# Patient Record
Sex: Female | Born: 1960 | Race: White | Hispanic: No | Marital: Married | Smoking: Current every day smoker
Health system: Southern US, Community
[De-identification: ages and names within clinical notes are randomized; demographics above are authoritative.]

## PROBLEM LIST (undated history)

## (undated) DIAGNOSIS — M549 Dorsalgia, unspecified: Secondary | ICD-10-CM

## (undated) DIAGNOSIS — C641 Malignant neoplasm of right kidney, except renal pelvis: Principal | ICD-10-CM

## (undated) DIAGNOSIS — C801 Malignant (primary) neoplasm, unspecified: Secondary | ICD-10-CM

## (undated) HISTORY — PX: APPENDECTOMY: SHX54

## (undated) HISTORY — PX: OOPHORECTOMY: SHX86

## (undated) HISTORY — DX: Malignant neoplasm of right kidney, except renal pelvis: C64.1

## (undated) HISTORY — PX: INSERTION OF MESH: SHX5868

## (undated) HISTORY — PX: TONSILLECTOMY: SUR1361

## (undated) HISTORY — PX: ABDOMINAL HYSTERECTOMY: SHX81

---

## 1999-05-17 ENCOUNTER — Ambulatory Visit (HOSPITAL_BASED_OUTPATIENT_CLINIC_OR_DEPARTMENT_OTHER): Admission: RE | Admit: 1999-05-17 | Discharge: 1999-05-17 | Payer: Self-pay | Admitting: General Surgery

## 1999-12-02 ENCOUNTER — Encounter: Payer: Self-pay | Admitting: Family Medicine

## 1999-12-02 ENCOUNTER — Ambulatory Visit (HOSPITAL_COMMUNITY): Admission: RE | Admit: 1999-12-02 | Discharge: 1999-12-02 | Payer: Self-pay | Admitting: Family Medicine

## 2000-05-18 ENCOUNTER — Emergency Department (HOSPITAL_COMMUNITY): Admission: EM | Admit: 2000-05-18 | Discharge: 2000-05-19 | Payer: Self-pay | Admitting: Emergency Medicine

## 2000-05-19 ENCOUNTER — Encounter: Payer: Self-pay | Admitting: Emergency Medicine

## 2000-07-20 ENCOUNTER — Other Ambulatory Visit: Admission: RE | Admit: 2000-07-20 | Discharge: 2000-07-20 | Payer: Self-pay | Admitting: Family Medicine

## 2005-02-04 ENCOUNTER — Encounter: Admission: RE | Admit: 2005-02-04 | Discharge: 2005-02-04 | Payer: Self-pay | Admitting: Orthopaedic Surgery

## 2006-11-29 ENCOUNTER — Ambulatory Visit (HOSPITAL_BASED_OUTPATIENT_CLINIC_OR_DEPARTMENT_OTHER): Admission: RE | Admit: 2006-11-29 | Discharge: 2006-11-29 | Payer: Self-pay | Admitting: Orthopedic Surgery

## 2007-06-10 ENCOUNTER — Emergency Department (HOSPITAL_COMMUNITY): Admission: EM | Admit: 2007-06-10 | Discharge: 2007-06-10 | Payer: Self-pay | Admitting: Emergency Medicine

## 2010-08-17 ENCOUNTER — Ambulatory Visit (HOSPITAL_COMMUNITY)
Admission: AD | Admit: 2010-08-17 | Discharge: 2010-08-18 | Payer: Self-pay | Source: Home / Self Care | Attending: General Surgery | Admitting: General Surgery

## 2010-08-22 LAB — URINALYSIS, ROUTINE W REFLEX MICROSCOPIC
Bilirubin Urine: NEGATIVE
Ketones, ur: NEGATIVE mg/dL
Leukocytes, UA: NEGATIVE
Nitrite: NEGATIVE
Protein, ur: NEGATIVE mg/dL
Specific Gravity, Urine: 1.026 (ref 1.005–1.030)
Urine Glucose, Fasting: NEGATIVE mg/dL
Urobilinogen, UA: 0.2 mg/dL (ref 0.0–1.0)
pH: 6 (ref 5.0–8.0)

## 2010-08-22 LAB — SURGICAL PCR SCREEN
MRSA, PCR: NEGATIVE
Staphylococcus aureus: NEGATIVE

## 2010-08-22 LAB — BASIC METABOLIC PANEL
BUN: 11 mg/dL (ref 6–23)
CO2: 28 mEq/L (ref 19–32)
Calcium: 10.1 mg/dL (ref 8.4–10.5)
Chloride: 105 mEq/L (ref 96–112)
Creatinine, Ser: 0.86 mg/dL (ref 0.4–1.2)
GFR calc Af Amer: >60 mL/min (ref >60)
GFR calc non Af Amer: >60 mL/min (ref >60)
Glucose, Bld: 75 mg/dL (ref 70–99)
Potassium: 3.7 mEq/L (ref 3.5–5.1)
Sodium: 143 mEq/L (ref 135–145)

## 2010-08-22 LAB — CBC
HCT: 45.1 % (ref 36.0–46.0)
Hemoglobin: 15.1 g/dL — ABNORMAL HIGH (ref 12.0–15.0)
MCH: 30.4 pg (ref 26.0–34.0)
MCHC: 33.5 g/dL (ref 30.0–36.0)
MCV: 90.7 fL (ref 78.0–100.0)
Platelets: 322 10*3/uL (ref 150–400)
RBC: 4.97 MIL/uL (ref 3.87–5.11)
RDW: 12.7 % (ref 11.5–15.5)
WBC: 11.5 10*3/uL — ABNORMAL HIGH (ref 4.0–10.5)

## 2010-08-22 LAB — URINE CULTURE
Colony Count: 25000
Culture  Setup Time: 201201111119
Special Requests: POSITIVE

## 2010-08-22 LAB — URINE MICROSCOPIC-ADD ON

## 2010-12-23 NOTE — Op Note (Signed)
Maria Armstrong, Maria Armstrong                ACCOUNT NO.:  [id number]   MEDICAL RECORD NO.:  [id number]          PATIENT TYPE:  AMB   LOCATION:  DSC                          FACILITY:  [facility]   PHYSICIAN:  Cindee Salt, M.D.       DATE OF BIRTH:  08-15-1960   DATE OF PROCEDURE:  11/29/2006  DATE OF DISCHARGE:                               OPERATIVE REPORT   PREOPERATIVE DIAGNOSIS:  Carpal tunnel syndrome right hand.   POSTOPERATIVE DIAGNOSIS:  Carpal tunnel syndrome right hand.   OPERATION:  Decompression right median nerve.   SURGEON:  Cindee Salt, M.D.   ASSISTANT:  Carolyne Fiscal R.N.   ANESTHESIA:  Forearm based IV regional.   HISTORY:  The patient is a 50 year old female with a history of carpal  tunnel syndrome, EMG nerve conductions positive, which has not responded  to conservative treatment.  She is aware of risks and complications  including infection, recurrence, injury to arteries, nerves, tendons  incomplete relief of symptoms, dystrophy.  She has elected to proceed  with carpal tunnel release.  In the preoperative area questions were  encouraged and answered.  The extremity marked by both the patient and  surgeon.   PROCEDURE:  The patient was brought to the operating room where a  forearm based IV regional anesthetic was carried out without difficulty.  She was prepped using DuraPrep, supine position, right arm free.  After  3-minute dry time she was draped.  A longitudinal incision was made in  the palm, carried down through subcutaneous tissue.  Bleeders were  electrocauterized palmar fascia was split, superficial palmar arch  identified.  The flexor tendon to the ring and little finger identified  to the ulnar side of the median nerve.  Carpal retinaculum was incised  with sharp dissection.  A right-angle and Sewall retractor were placed  between skin and forearm fascia.  The fascia was released for  approximately a centimeter and a half proximal to the wrist crease under  direct  vision.  Area compression to the nerve was apparent.  No further  lesions were identified.  Tenosynovial tissue was moderately thickened.  The wound was irrigated.  The skin was closed with interrupted 4-0  Vicryl Rapide sutures.  A sterile compressive dressing and splint was  applied.  On deflation of the tourniquet all fingers immediately pinked.  She was taken to the recovery room for observation in satisfactory  condition.  She is discharged home to return to the [facility]  [place] in one week on [place].           ______________________________  Cindee Salt, M.D.    GK/MEDQ  D:  11/29/2006  T:  11/29/2006  Job:  [id number]   cc:   Melida Quitter, M.D.

## 2014-07-14 ENCOUNTER — Other Ambulatory Visit: Payer: Self-pay | Admitting: Family Medicine

## 2014-07-14 ENCOUNTER — Ambulatory Visit
Admission: RE | Admit: 2014-07-14 | Discharge: 2014-07-14 | Disposition: A | Discharge status: Home / Self Care | Payer: Commercial Managed Care - PPO | Source: Ambulatory Visit | Attending: Family Medicine | Admitting: Family Medicine

## 2014-07-14 DIAGNOSIS — J189 Pneumonia, unspecified organism: Secondary | ICD-10-CM

## 2015-01-07 ENCOUNTER — Other Ambulatory Visit: Payer: Self-pay | Admitting: Family Medicine

## 2015-01-07 ENCOUNTER — Ambulatory Visit
Admission: RE | Admit: 2015-01-07 | Discharge: 2015-01-07 | Disposition: A | Discharge status: Home / Self Care | Payer: PRIVATE HEALTH INSURANCE | Source: Ambulatory Visit | Attending: Family Medicine | Admitting: Family Medicine

## 2015-01-07 DIAGNOSIS — N2889 Other specified disorders of kidney and ureter: Secondary | ICD-10-CM

## 2015-01-07 DIAGNOSIS — K769 Liver disease, unspecified: Secondary | ICD-10-CM

## 2015-01-07 DIAGNOSIS — R1084 Generalized abdominal pain: Secondary | ICD-10-CM

## 2015-01-08 ENCOUNTER — Ambulatory Visit
Admission: RE | Admit: 2015-01-08 | Discharge: 2015-01-08 | Disposition: A | Discharge status: Home / Self Care | Payer: 59 | Source: Ambulatory Visit | Attending: Family Medicine | Admitting: Family Medicine

## 2015-01-08 DIAGNOSIS — K769 Liver disease, unspecified: Secondary | ICD-10-CM

## 2015-01-08 DIAGNOSIS — N2889 Other specified disorders of kidney and ureter: Secondary | ICD-10-CM

## 2015-01-08 MED ORDER — IOPAMIDOL (ISOVUE-300) INJECTION 61%
100.0000 mL | Freq: Once | INTRAVENOUS | Status: AC | PRN
Start: 2015-01-08 — End: 2015-01-08
  Administered 2015-01-08: 100 mL via INTRAVENOUS

## 2015-01-18 ENCOUNTER — Telehealth: Payer: Self-pay | Admitting: Hematology & Oncology

## 2015-01-18 NOTE — Telephone Encounter (Signed)
Called referring office [facility], informed of future appt on 6/17

## 2015-01-19 ENCOUNTER — Telehealth: Payer: Self-pay | Admitting: Hematology & Oncology

## 2015-01-19 NOTE — Telephone Encounter (Signed)
S/w pt confirming NP FC/labs/ov, pt needed to move to 06/16 due to her husband is a truck driver and wants her husband to be here with her. S/w Micheline Maze and she r/s pt to the 16th of June pt confirmed.... KJ

## 2015-01-20 ENCOUNTER — Telehealth: Payer: Self-pay | Admitting: Hematology & Oncology

## 2015-01-20 NOTE — Telephone Encounter (Signed)
I spoke w NEW PATIENT today to remind them of their appointment with Dr. Ennever. Also, advised them to bring all medication bottles and insurance card information. ° °

## 2015-01-21 ENCOUNTER — Ambulatory Visit (HOSPITAL_BASED_OUTPATIENT_CLINIC_OR_DEPARTMENT_OTHER): Payer: 59 | Admitting: Family

## 2015-01-21 ENCOUNTER — Other Ambulatory Visit (HOSPITAL_BASED_OUTPATIENT_CLINIC_OR_DEPARTMENT_OTHER): Payer: 59

## 2015-01-21 ENCOUNTER — Encounter (HOSPITAL_BASED_OUTPATIENT_CLINIC_OR_DEPARTMENT_OTHER): Payer: Self-pay

## 2015-01-21 ENCOUNTER — Ambulatory Visit: Payer: 59

## 2015-01-21 ENCOUNTER — Ambulatory Visit (HOSPITAL_BASED_OUTPATIENT_CLINIC_OR_DEPARTMENT_OTHER)
Admission: RE | Admit: 2015-01-21 | Discharge: 2015-01-21 | Disposition: A | Discharge status: Home / Self Care | Payer: 59 | Source: Ambulatory Visit | Attending: Family | Admitting: Family

## 2015-01-21 ENCOUNTER — Encounter: Payer: Self-pay | Admitting: Family

## 2015-01-21 VITALS — BP 109/45 | HR 98 | Temp 98.3°F | Resp 16 | Ht 64.0 in | Wt 198.0 lb

## 2015-01-21 DIAGNOSIS — C649 Malignant neoplasm of unspecified kidney, except renal pelvis: Secondary | ICD-10-CM

## 2015-01-21 DIAGNOSIS — C787 Secondary malignant neoplasm of liver and intrahepatic bile duct: Secondary | ICD-10-CM | POA: Insufficient documentation

## 2015-01-21 HISTORY — DX: Malignant (primary) neoplasm, unspecified: C80.1

## 2015-01-21 LAB — CBC WITH DIFFERENTIAL (CANCER CENTER ONLY)
BASO#: 0 10*3/uL (ref 0.0–0.2)
BASO%: 0.2 % (ref 0.0–2.0)
EOS%: 1.5 % (ref 0.0–7.0)
Eosinophils Absolute: 0.2 10*3/uL (ref 0.0–0.5)
HCT: 32.3 % — ABNORMAL LOW (ref 34.8–46.6)
HGB: 10.2 g/dL — ABNORMAL LOW (ref 11.6–15.9)
LYMPH#: 2.7 10*3/uL (ref 0.9–3.3)
LYMPH%: 17.2 % (ref 14.0–48.0)
MCH: 25.2 pg — ABNORMAL LOW (ref 26.0–34.0)
MCHC: 31.6 g/dL — ABNORMAL LOW (ref 32.0–36.0)
MCV: 80 fL — ABNORMAL LOW (ref 81–101)
MONO#: 1.7 10*3/uL — ABNORMAL HIGH (ref 0.1–0.9)
MONO%: 10.6 % (ref 0.0–13.0)
NEUT#: 11.2 10*3/uL — ABNORMAL HIGH (ref 1.5–6.5)
NEUT%: 70.5 % (ref 39.6–80.0)
Platelets: 292 10*3/uL (ref 145–400)
RBC: 4.05 10*6/uL (ref 3.70–5.32)
RDW: 15.4 % (ref 11.1–15.7)
WBC: 15.8 10*3/uL — ABNORMAL HIGH (ref 3.9–10.0)

## 2015-01-21 LAB — CMP (CANCER CENTER ONLY)
ALT(SGPT): 90 U/L — ABNORMAL HIGH (ref 10–47)
AST: 201 U/L (ref 11–38)
Albumin: 2.6 g/dL — ABNORMAL LOW (ref 3.3–5.5)
Alkaline Phosphatase: 269 U/L — ABNORMAL HIGH (ref 26–84)
BUN, Bld: 12 mg/dL (ref 7–22)
CO2: 26 mEq/L (ref 18–33)
Calcium: 10.8 mg/dL — ABNORMAL HIGH (ref 8.0–10.3)
Chloride: 90 mEq/L — ABNORMAL LOW (ref 98–108)
Creat: 1.3 mg/dl — ABNORMAL HIGH (ref 0.6–1.2)
Glucose, Bld: 149 mg/dL — ABNORMAL HIGH (ref 73–118)
Potassium: 4.6 mEq/L (ref 3.3–4.7)
Sodium: 131 mEq/L (ref 128–145)
Total Bilirubin: 0.8 mg/dl (ref 0.20–1.60)
Total Protein: 7.3 g/dL (ref 6.4–8.1)

## 2015-01-21 MED ORDER — ZOLEDRONIC ACID 4 MG/5ML IV CONC: 4.0000 mg | Freq: Once | INTRAVENOUS | Status: DC

## 2015-01-21 MED ORDER — MORPHINE SULFATE 4 MG/ML IJ SOLN
INTRAMUSCULAR | Status: AC
  Filled 2015-01-21: qty 1

## 2015-01-21 MED ORDER — SODIUM CHLORIDE 0.9 % IV SOLN
Freq: Once | INTRAVENOUS | Status: DC
Start: 2015-01-21 — End: 2015-01-22

## 2015-01-21 MED ORDER — IOHEXOL 300 MG/ML  SOLN
75.0000 mL | Freq: Once | INTRAMUSCULAR | Status: AC | PRN
  Administered 2015-01-21: 75 mL via INTRAVENOUS

## 2015-01-21 MED ORDER — MORPHINE SULFATE 4 MG/ML IJ SOLN: 4.0000 mg | Freq: Once | INTRAMUSCULAR | Status: DC

## 2015-01-21 NOTE — Progress Notes (Signed)
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 She had been diagnosed with a UTI and placed on antibiotics 2 weeks before. Her urine continued to be dark and had a foul odor.  A CT of the abdomen and pelvis showed large right renal cell carcinoma and extensive liver metastasis. We did get a chest CT which showed scattered small lung nodule suspicious for metastatic disease and also soft tissue masses of the right 3rd and left 9th ribs.  Her LFT's and WBC count are elevated today. Her Hgb is holding at 10.2.  She has been a smoker since a young age and was smoke 1 1/2 ppd. She is now down to 3-4 cigarettes a day.  She is having sever constipation which she attributes to the opioids she takes daily and has only had 2 bowel movements in the last month. We will start her on Lactulose today. She refused pain medication while in the office and stated she felt that her abdominal pain was from constipation.  She has had a laminectomy to C4 and C5 and has chronic back pain. She is seen by Dr. Sherre Lain at [facility] in [place]. 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  She no longer has her cycles. She states that her PCP has checked labs on her and that she is actiovely going through menopause. She has night sweats.  She has no appetite. She feels "full and nauseated all the time." As a result, she has not been eating well or drinking fluids. She is dehydrated so we will bring her in tomorrow for fluids, Zometa and possibly antibiotics. She  states that she is down 26 lbs in the last 2 months.  She is SOB with any exertion.  She has had no fever, chills, chest pain, palpitations, diarrhea, blood in stool. She has had blood in her urine.  There is no family history of cancer.  She had a 25 lb fibroid removed from her abdomen in 1998. This was found to be benign and she has abdominal mesh in place. This was replaced in 2012.  She has 2 children both of whom are healthy. She did have on miscarriage.  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  She is now disabled.   ROS: All other 10 point review of systems is negative.   PAST MEDICAL HISTORY:   No past medical history on file.  ALLERGIES: Allergies not on file    MEDICATIONS:  No current outpatient prescriptions on file prior to visit.   No current facility-administered medications on file prior to visit.     PAST SURGICAL HISTORY No past surgical history on file.  FAMILY HISTORY: No family history on file.  SOCIAL HISTORY:  has no tobacco, alcohol, and drug history on file.  PERFORMANCE STATUS: The patient's performance status is 1 - Symptomatic but completely ambulatory  PHYSICAL EXAM: Most Recent Vital Signs: There were no vitals taken for this visit.  BP 109/45 mmHg  Pulse 98  Temp(Src) 98.3 F (36.8 C) (Oral)  Resp 16  Ht 5\' 4"  (1.626 m)  Wt 198 lb (89.812 kg)  BMI 33.97 kg/m2  General Appearance:    Alert, cooperative, no distress, appears stated age  Head:    Normocephalic, without obvious abnormality, atraumatic  Eyes:    PERRL, conjunctiva/corneas clear, EOM's intact, fundi    benign, both eyes        Throat:   Lips, mucosa, and tongue normal; teeth and gums normal  Neck:   Supple, symmetrical, trachea midline, no adenopathy;    thyroid:  no enlargement/tenderness/nodules; no carotid   bruit or JVD  Back:     Symmetric, no curvature, ROM normal, no CVA tenderness  Lungs:     Clear to  auscultation bilaterally, respirations unlabored  Chest Wall:    No tenderness or deformity   Heart:    Regular rate and rhythm, S1 and S2 normal, no murmur, rub   or gallop     Abdomen:     Soft, tender in right flank, bowel sounds active all four quadrants        Extremities:   Extremities normal, atraumatic, no cyanosis or edema  Pulses:   2+ and symmetric all extremities  Skin:   Skin color, texture, turgor normal, no rashes or lesions  Lymph nodes:   Cervical, supraclavicular, and axillary nodes normal  Neurologic:   CNII-XII intact, normal strength, sensation and reflexes    throughout   LABORATORY DATA:  Results for orders placed or performed in visit on 01/21/15 (from the past 48 hour(s))  CBC with Differential [facility])     Status: Abnormal   Collection Time: 01/21/15  2:49 PM  Result Value Ref Range   WBC 15.8 (H) 3.9 - 10.0 10e3/uL   RBC 4.05 3.70 - 5.32 10e6/uL   HGB 10.2 (L) 11.6 - 15.9 g/dL   HCT 32.3 (L) 34.8 - 46.6 %   MCV 80 (L) 81 - 101 fL   MCH 25.2 (L) 26.0 - 34.0 pg   MCHC 31.6 (L) 32.0 - 36.0 g/dL   RDW 15.4 11.1 - 15.7 %   Platelets 292 145 - 400 10e3/uL   NEUT# 11.2 (H) 1.5 - 6.5 10e3/uL   LYMPH# 2.7 0.9 - 3.3 10e3/uL   MONO# 1.7 (H) 0.1 - 0.9 10e3/uL   Eosinophils Absolute 0.2 0.0 - 0.5 10e3/uL   BASO# 0.0 0.0 - 0.2 10e3/uL   NEUT% 70.5 39.6 - 80.0 %   LYMPH% 17.2 14.0 - 48.0 %   MONO% 10.6 0.0 - 13.0 %   EOS% 1.5 0.0 - 7.0 %   BASO% 0.2 0.0 - 2.0 %      RADIOGRAPHY: No results found.     PATHOLOGY: None   ASSESSMENT/PLAN: Maria Armstrong is a very sweet 54 yo woman with large right renal cell carcinoma and extensive liver metastasis. She is symptomatic with fatigue, SOB, abdominal pain, n/v and weight loss. Her LFT's are quite elevated today.  We did get a chest CT which showed scattered small lung nodule suspicious for metastatic disease and also soft tissue masses of the right 3rd and left 9th ribs.  We will get an MRI of the brain next week.   We will also consult Urology to see if she is a candidate for surgery to have the right kidney removed. We will get a urinalysis tomorrow while she is here getting fluids and Zometa. She may also need  antibiotics.  She is starting Lactulose today.  All questions were answered. The patient knows to call the clinic with any problems, questions or concerns. We can certainly see the patient much sooner if necessary.  The patient was discussed with and also seen by Dr. Marin Olp and he is in agreement with the aforementioned.   [facility]   Addendum:  I saw and examined the patient with Johnnette Laux.  This is a very difficult situation. She has extensive metastatic renal cell carcinoma. I think the really issue is whether or not she is going to be a candidate for a right nephrectomy. Studies have shown that there is a benefit for nephrectomy in the presence of metastatic disease. She is having quite a bit of pain over on the right side. The pain is likely from this tumor. She also could be having pain from the liver metastasis.  We did go ahead and get a CT of her chest. This did show lung nodules. She had some rib involvement.  She is somewhat hypercalcemic. She needs IV fluids. She needs some IV Zometa.  I spoke to she and her family. They are all very nice. I know this is a total shock to her.  She realizes that this is a situation that is treatable but not curable.  We really need to find out the histology on the renal cell carcinoma in order to really know how well she will do. When he is see if she has a clear cell carcinoma or a sarcomatoid  histology. Clear cell carcinoma is much better.  I need to speak with to urology. I did see if they will see her and consider her for a palliative nephrectomy.  As far as systemic therapy is concerned, she would be a good candidate for systemic therapy. I prefer to use Votrient upfront. People tolerate this well. There is a good response rate.  If  she has a sarcomatoid histology, then the favored up front therapy is temsirolimus (Torisel).  We spent a good hour with her. We gave her a prayer blanket.  We do need to get an MRI of the brain. It would not surprise me if she has asymptomatic metastasis.  We will try to move along as quickly as possible so that we can get her on therapy.  If she is not deemed to be a candidate for surgical resection of the right kidney, then a biopsy is what we will will need to do.  Lum Keas

## 2015-01-21 NOTE — Progress Notes (Signed)
When getting ready to give pt Morphine, she shares that if makes her very nauseous.  Added to allergy profile.  Pt states pain has subsided, she had taken her Percocet.  Dr. Marin Olp in attendance and aware.  Given Lactulose for constipation, states her pain will be much improved when she has BM.

## 2015-01-22 ENCOUNTER — Ambulatory Visit: Payer: 59

## 2015-01-22 ENCOUNTER — Telehealth: Payer: Self-pay | Admitting: Hematology & Oncology

## 2015-01-22 ENCOUNTER — Ambulatory Visit (HOSPITAL_BASED_OUTPATIENT_CLINIC_OR_DEPARTMENT_OTHER): Payer: 59

## 2015-01-22 ENCOUNTER — Other Ambulatory Visit: Payer: Self-pay | Admitting: Family

## 2015-01-22 ENCOUNTER — Other Ambulatory Visit: Payer: 59

## 2015-01-22 ENCOUNTER — Ambulatory Visit (HOSPITAL_BASED_OUTPATIENT_CLINIC_OR_DEPARTMENT_OTHER): Payer: 59 | Admitting: Nurse Practitioner

## 2015-01-22 ENCOUNTER — Ambulatory Visit: Payer: 59 | Admitting: Hematology & Oncology

## 2015-01-22 VITALS — BP 113/44 | HR 92 | Temp 99.3°F | Resp 16 | Ht 64.0 in | Wt 197.0 lb

## 2015-01-22 DIAGNOSIS — C641 Malignant neoplasm of right kidney, except renal pelvis: Secondary | ICD-10-CM

## 2015-01-22 DIAGNOSIS — C649 Malignant neoplasm of unspecified kidney, except renal pelvis: Secondary | ICD-10-CM | POA: Diagnosis not present

## 2015-01-22 DIAGNOSIS — N390 Urinary tract infection, site not specified: Secondary | ICD-10-CM

## 2015-01-22 DIAGNOSIS — C799 Secondary malignant neoplasm of unspecified site: Principal | ICD-10-CM

## 2015-01-22 DIAGNOSIS — C787 Secondary malignant neoplasm of liver and intrahepatic bile duct: Secondary | ICD-10-CM | POA: Diagnosis not present

## 2015-01-22 LAB — URINALYSIS, MICROSCOPIC (CHCC SATELLITE)
BILIRUBIN (URINE): POSITIVE
Glucose: NEGATIVE mg/dL
Ketones: NEGATIVE mg/dL
Nitrite: POSITIVE
PROTEIN: 30 mg/dL
SPECIFIC GRAVITY, URINE: 1.02 (ref 1.003–1.035)
Urobilinogen, UR: 0.2 mg/dL (ref 0.2–1)
pH: 6 (ref 4.60–8.00)

## 2015-01-22 LAB — LACTATE DEHYDROGENASE: LDH: 2925 U/L — ABNORMAL HIGH (ref 94–250)

## 2015-01-22 LAB — PREALBUMIN: Prealbumin: 7 mg/dL — ABNORMAL LOW (ref 17–34)

## 2015-01-22 MED ORDER — ZOLEDRONIC ACID 4 MG/100ML IV SOLN
4.0000 mg | Freq: Once | INTRAVENOUS | Status: AC
  Administered 2015-01-22: 4 mg via INTRAVENOUS
  Filled 2015-01-22: qty 100

## 2015-01-22 MED ORDER — AMPICILLIN-SULBACTAM SODIUM 3 (2-1) G IJ SOLR
3.0000 g | Freq: Once | INTRAMUSCULAR | Status: AC
  Administered 2015-01-22: 3 g via INTRAVENOUS
  Filled 2015-01-22: qty 3

## 2015-01-22 MED ORDER — OXYCODONE HCL ER 40 MG PO T12A: 40.0000 mg | EXTENDED_RELEASE_TABLET | Freq: Two times a day (BID) | ORAL | Status: AC

## 2015-01-22 MED ORDER — DEXAMETHASONE 4 MG PO TABS: 12.0000 mg | ORAL_TABLET | Freq: Every day | ORAL | Status: AC

## 2015-01-22 MED ORDER — OXYCODONE HCL 20 MG PO TABS: ORAL_TABLET | ORAL | Status: DC

## 2015-01-22 MED ORDER — SODIUM CHLORIDE 0.9 % IV SOLN
40.0000 mg | Freq: Once | INTRAVENOUS | Status: AC
  Administered 2015-01-22: 40 mg via INTRAVENOUS
  Filled 2015-01-22: qty 4

## 2015-01-22 MED ORDER — FLUCONAZOLE 100 MG PO TABS: 100.0000 mg | ORAL_TABLET | Freq: Every day | ORAL | Status: AC

## 2015-01-22 MED ORDER — DEXAMETHASONE SODIUM PHOSPHATE 20 MG/5ML IJ SOLN
INTRAMUSCULAR | Status: AC
  Filled 2015-01-22: qty 10

## 2015-01-22 MED ORDER — AMOXICILLIN-POT CLAVULANATE 875-125 MG PO TABS: 1.0000 | ORAL_TABLET | Freq: Two times a day (BID) | ORAL | Status: DC

## 2015-01-22 MED ORDER — SODIUM CHLORIDE 0.9 % IV SOLN
Freq: Once | INTRAVENOUS | Status: AC
  Administered 2015-01-22: 11:00:00 via INTRAVENOUS

## 2015-01-22 NOTE — Telephone Encounter (Signed)
I spoke w Christy @ [place] and she advised NPR for chemo below:   J3489 Zoledronic Acid 4 MG/100ML Soln 100 mL Plas Cont        J7030 sodium chloride 0.9 % Soln        J1100 dexamethasone 100 MG/10ML Soln 10 mL Vial        J3490 sodium chloride 0.9 % Soln 50 mL Flex Cont        J0295 Ampicillin-Sulbactam 3 (2-1) G Solr 1 each Vial        J3490 sodium chloride 0.9 % Soln 100 mL Flex Cont             P: [phone]

## 2015-01-22 NOTE — Patient Instructions (Addendum)
Smoking Cessation Quitting smoking is important to your health and has many advantages. However, it is not always easy to quit since nicotine is a very addictive drug. Oftentimes, people try 3 times or more before being able to quit. This document explains the best ways for you to prepare to quit smoking. Quitting takes hard work and a lot of effort, but you can do it. ADVANTAGES OF QUITTING SMOKING  You will live longer, feel better, and live better.  Your body will feel the impact of quitting smoking almost immediately.  Within 20 minutes, blood pressure decreases. Your pulse returns to its normal level.  After 8 hours, carbon monoxide levels in the blood return to normal. Your oxygen level increases.  After 24 hours, the chance of having a heart attack starts to decrease. Your breath, hair, and body stop smelling like smoke.  After 48 hours, damaged nerve endings begin to recover. Your sense of taste and smell improve.  After 72 hours, the body is virtually free of nicotine. Your bronchial tubes relax and breathing becomes easier.  After 2 to 12 weeks, lungs can hold more air. Exercise becomes easier and circulation improves.  The risk of having a heart attack, stroke, cancer, or lung disease is greatly reduced.  After 1 year, the risk of coronary heart disease is cut in half.  After 5 years, the risk of stroke falls to the same as a nonsmoker.  After 10 years, the risk of lung cancer is cut in half and the risk of other cancers decreases significantly.  After 15 years, the risk of coronary heart disease drops, usually to the level of a nonsmoker.  If you are pregnant, quitting smoking will improve your chances of having a healthy baby.  The people you live with, especially any children, will be healthier.  You will have extra money to spend on things other than cigarettes. QUESTIONS TO THINK ABOUT BEFORE ATTEMPTING TO QUIT You may want to talk about your answers with your  health care provider.  Why do you want to quit?  If you tried to quit in the past, what helped and what did not?  What will be the most difficult situations for you after you quit? How will you plan to handle them?  Who can help you through the tough times? Your family? Friends? A health care provider?  What pleasures do you get from smoking? What ways can you still get pleasure if you quit? Here are some questions to ask your health care provider:  How can you help me to be successful at quitting?  What medicine do you think would be best for me and how should I take it?  What should I do if I need more help?  What is smoking withdrawal like? How can I get information on withdrawal? GET READY  Set a quit date.  Change your environment by getting rid of all cigarettes, ashtrays, matches, and lighters in your home, car, or work. Do not let people smoke in your home.  Review your past attempts to quit. Think about what worked and what did not. GET SUPPORT AND ENCOURAGEMENT You have a better chance of being successful if you have help. You can get support in many ways.  Tell your family, friends, and coworkers that you are going to quit and need their support. Ask them not to smoke around you.  Get individual, group, or telephone counseling and support. Programs are available at local hospitals and health centers. Call   your local health department for information about programs in your area.  Spiritual beliefs and practices may help some smokers quit.  Download a "quit meter" on your computer to keep track of quit statistics, such as how long you have gone without smoking, cigarettes not smoked, and money saved.  Get a self-help book about quitting smoking and staying off tobacco. [place] yourself from urges to smoke. Talk to someone, go for a walk, or occupy your time with a task.  Change your normal routine. Take a different route to work.  Drink tea instead of coffee. Eat breakfast in a different place.  Reduce your stress. Take a hot bath, exercise, or read a book.  Plan something enjoyable to do every day. Reward yourself for not smoking.  Explore interactive web-based programs that specialize in helping you quit. GET MEDICINE AND USE IT CORRECTLY Medicines can help you stop smoking and decrease the urge to smoke. Combining medicine with the above behavioral methods and support can greatly increase your chances of successfully quitting smoking.  Nicotine replacement therapy helps deliver nicotine to your body without the negative effects and risks of smoking. Nicotine replacement therapy includes nicotine gum, lozenges, inhalers, nasal sprays, and skin patches. Some may be available over-the-counter and others require a prescription.  Antidepressant medicine helps people abstain from smoking, but how this works is unknown. This medicine is available by prescription.  Nicotinic receptor partial agonist medicine simulates the effect of nicotine in your brain. This medicine is available by prescription. Ask your health care provider for advice about which medicines to use and how to use them based on your health history. Your health care provider will tell you what side effects to look out for if you choose to be on a medicine or therapy. Carefully read the information on the package. Do not use any other product containing nicotine while using a nicotine replacement product.  RELAPSE OR DIFFICULT SITUATIONS Most relapses occur within the first 3 months after quitting. Do not be discouraged if you start smoking again. Remember, most people try several times before finally quitting. You may have symptoms of withdrawal because your body is used to nicotine. You may crave cigarettes, be irritable, feel very hungry, cough often, get headaches, or have difficulty concentrating. The withdrawal symptoms are only temporary. They are strongest  when you first quit, but they will go away within 10-14 days. To reduce the chances of relapse, try to:  Avoid drinking alcohol. Drinking lowers your chances of successfully quitting.  Reduce the amount of caffeine you consume. Once you quit smoking, the amount of caffeine in your body increases and can give you symptoms, such as a rapid heartbeat, sweating, and anxiety.  Avoid smokers because they can make you want to smoke.  Do not let weight gain distract you. Many smokers will gain weight when they quit, usually less than 10 pounds. Eat a healthy diet and stay active. You can always lose the weight gained after you quit.  Find ways to improve your mood other than smoking. FOR MORE INFORMATION  www.smokefree.gov  Document Released: 07/18/2001 Document Revised: 12/08/2013 Document Reviewed: 11/02/2011 [facility] Patient Information 2015 [place], [place]. This information is not intended to replace advice given to you by your health care provider. Make sure you discuss any questions you have with your health care provider. Dexamethasone injection What is this medicine? DEXAMETHASONE (dex a METH a sone) is a corticosteroid. It is used to treat inflammation of  the skin, joints, lungs, and other organs. Common conditions treated include asthma, allergies, and arthritis. It is also used for other conditions, like blood disorders and diseases of the adrenal glands. This medicine may be used for other purposes; ask your health care provider or pharmacist if you have questions. COMMON BRAND NAME(S): Decadron, Solurex What should I tell my health care provider before I take this medicine? They need to know if you have any of these conditions: -blood clotting problems -Cushing's syndrome -diabetes -glaucoma -heart problems or disease -high blood pressure -infection like herpes, measles, tuberculosis, or chickenpox -kidney disease -liver disease -mental problems -myasthenia  gravis -osteoporosis -previous heart attack -seizures -stomach, ulcer or intestine disease including colitis and diverticulitis -thyroid problem -an unusual or allergic reaction to dexamethasone, corticosteroids, other medicines, lactose, foods, dyes, or preservatives -pregnant or trying to get pregnant -breast-feeding How should I use this medicine? This medicine is for injection into a muscle, joint, lesion, soft tissue, or vein. It is given by a health care professional in a hospital or clinic setting. Talk to your pediatrician regarding the use of this medicine in children. Special care may be needed. Overdosage: If you think you have taken too much of this medicine contact a poison control center or emergency room at once. NOTE: This medicine is only for you. Do not share this medicine with others. What if I miss a dose? This may not apply. If you are having a series of injections over a prolonged period, try not to miss an appointment. Call your doctor or health care professional to reschedule if you are unable to keep an appointment. What may interact with this medicine? Do not take this medicine with any of the following medications: -mifepristone, RU-486 -vaccines This medicine may also interact with the following medications: -amphotericin B -antibiotics like clarithromycin, erythromycin, and troleandomycin -aspirin and aspirin-like drugs -barbiturates like phenobarbital -carbamazepine -cholestyramine -cholinesterase inhibitors like donepezil, galantamine, rivastigmine, and tacrine -cyclosporine -digoxin -diuretics -ephedrine -female hormones, like estrogens or progestins and birth control pills -indinavir -isoniazid -ketoconazole -medicines for diabetes -medicines that improve muscle tone or strength for conditions like myasthenia gravis -NSAIDs, medicines for pain and inflammation, like ibuprofen or naproxen -phenytoin -rifampin -thalidomide -warfarin This list  may not describe all possible interactions. Give your health care provider a list of all the medicines, herbs, non-prescription drugs, or dietary supplements you use. Also tell them if you smoke, drink alcohol, or use illegal drugs. Some items may interact with your medicine. What should I watch for while using this medicine? Your condition will be monitored carefully while you are receiving this medicine. If you are taking this medicine for a long time, carry an identification card with your name and address, the type and dose of your medicine, and your doctor's name and address. This medicine may increase your risk of getting an infection. Stay away from people who are sick. Tell your doctor or health care professional if you are around anyone with measles or chickenpox. Talk to your health care provider before you get any vaccines that you take this medicine. If you are going to have surgery, tell your doctor or health care professional that you have taken this medicine within the last twelve months. Ask your doctor or health care professional about your diet. You may need to lower the amount of salt you eat. The medicine can increase your blood sugar. If you are a diabetic check with your doctor if you need help adjusting the dose of your diabetic  medicine. What side effects may I notice from receiving this medicine? Side effects that you should report to your doctor or health care professional as soon as possible: -allergic reactions like skin rash, itching or hives, swelling of the face, lips, or tongue -black or tarry stools -change in the amount of urine -changes in vision -confusion, excitement, restlessness, a false sense of well-being -fever, sore throat, sneezing, cough, or other signs of infection, wounds that will not heal -hallucinations -increased thirst -mental depression, mood swings, mistaken feelings of self importance or of being mistreated -pain in hips, back, ribs, arms,  shoulders, or legs -pain, redness, or irritation at the injection site -redness, blistering, peeling or loosening of the skin, including inside the mouth -rounding out of face -swelling of feet or lower legs -unusual bleeding or bruising -unusual tired or weak -wounds that do not heal Side effects that usually do not require medical attention (report to your doctor or health care professional if they continue or are bothersome): -diarrhea or constipation -change in taste -headache -nausea, vomiting -skin problems, acne, thin and shiny skin -touble sleeping -unusual growth of hair on the face or body -weight gain This list may not describe all possible side effects. Call your doctor for medical advice about side effects. You may report side effects to FDA at 1-800-FDA-1088. Where should I keep my medicine? This drug is given in a hospital or clinic and will not be stored at home. NOTE: This sheet is a summary. It may not cover all possible information. If you have questions about this medicine, talk to your doctor, pharmacist, or health care provider.  2015, Elsevier/Gold Standard. (2007-11-14 14:04:12) Ampicillin; Sulbactam injection What is this medicine? AMPICILLIN; SULBACTAM (am pi SILL in; sul BAK tam) is a penicillin antibiotic. It is used to treat certain kinds of bacterial infections. It will not work for colds, flu, or other viral infections. This medicine may be used for other purposes; ask your health care provider or pharmacist if you have questions. COMMON BRAND NAME(S): Unasyn What should I tell my health care provider before I take this medicine? They need to know if you have any of these conditions: -heart disease -kidney disease -mononucleosis -an unusual or allergic reaction to ampicillin, other penicillins or antibiotics, foods, dyes, or preservatives -pregnant or trying to get pregnant -breast-feeding How should I use this medicine? This medicine is infused  into a vein or injected deep into a muscle. It is usually given by a health care professional in a hospital or clinic setting. If you get this medicine at home, you will be taught how to prepare and give this medicine. Use exactly as directed. Take your medicine at regular intervals. Do not take your medicine more often than directed. It is important that you put your used needles and syringes in a special sharps container. Do not put them in a trash can. If you do not have a sharps container, call your pharmacist or healthcare provider to get one. Talk to your pediatrician regarding the use of this medicine in children. Special care may be needed. Overdosage: If you think you have taken too much of this medicine contact a poison control center or emergency room at once. NOTE: This medicine is only for you. Do not share this medicine with others. What if I miss a dose? If you miss a dose, take it as soon as you can. If it is almost time for your next dose, take only that dose. Do not take double  or extra doses. What may interact with this medicine? -allopurinol -female hormones, including contraceptive or birth control pills -probenecid -some other antibiotics given by injection This list may not describe all possible interactions. Give your health care provider a list of all the medicines, herbs, non-prescription drugs, or dietary supplements you use. Also tell them if you smoke, drink alcohol, or use illegal drugs. Some items may interact with your medicine. What should I watch for while using this medicine? Tell your doctor or health care professional if your symptoms do not improve or if you get new symptoms. Do not treat diarrhea with over the counter products. Contact your doctor if you have diarrhea that lasts more than 2 days or if the diarrhea is severe and watery. This medicine can interfere with some urine glucose tests. If you use such tests, talk with your health care  professional. Birth control pills may not work properly while you are taking this medicine. Talk to your doctor about using an extra method of birth control. What side effects may I notice from receiving this medicine? Side effects that you should report to your doctor or health care professional as soon as possible: -allergic reactions like skin rash or hives, swelling of the face, lips, or tongue -chest pain -difficulty breathing -fever, chills -pain or difficulty passing urine -redness, blistering, peeling or loosening of the skin, including inside the mouth -seizures -unusual bleeding, bruising -unusually weak or tired Side effects that usually do not require medical attention (report to your doctor or health care professional if they continue or are bothersome): -diarrhea -headache -heartburn -nausea, vomiting -pain, irritation at the site of injection -sore mouth, tongue -stomach gas This list may not describe all possible side effects. Call your doctor for medical advice about side effects. You may report side effects to FDA at 1-800-FDA-1088. Where should I keep my medicine? Keep out of the reach of children. You will be instructed on how to store this medicine. Throw away any unused medicine after the expiration date on the label. NOTE: This sheet is a summary. It may not cover all possible information. If you have questions about this medicine, talk to your doctor, pharmacist, or health care provider.  2015, Elsevier/Gold Standard. (2007-10-15 14:37:17) Zoledronic Acid injection (Hypercalcemia, Oncology) What is this medicine? ZOLEDRONIC ACID (ZOE le dron ik AS id) lowers the amount of calcium loss from bone. It is used to treat too much calcium in your blood from cancer. It is also used to prevent complications of cancer that has spread to the bone. This medicine may be used for other purposes; ask your health care provider or pharmacist if you have questions. COMMON BRAND  NAME(S): Zometa What should I tell my health care provider before I take this medicine? They need to know if you have any of these conditions: -aspirin-sensitive asthma -cancer, especially if you are receiving medicines used to treat cancer -dental disease or wear dentures -infection -kidney disease -receiving corticosteroids like dexamethasone or prednisone -an unusual or allergic reaction to zoledronic acid, other medicines, foods, dyes, or preservatives -pregnant or trying to get pregnant -breast-feeding How should I use this medicine? This medicine is for infusion into a vein. It is given by a health care professional in a hospital or clinic setting. Talk to your pediatrician regarding the use of this medicine in children. Special care may be needed. Overdosage: If you think you have taken too much of this medicine contact a poison control center or emergency room at once.  NOTE: This medicine is only for you. Do not share this medicine with others. What if I miss a dose? It is important not to miss your dose. Call your doctor or health care professional if you are unable to keep an appointment. What may interact with this medicine? -certain antibiotics given by injection -NSAIDs, medicines for pain and inflammation, like ibuprofen or naproxen -some diuretics like bumetanide, furosemide -teriparatide -thalidomide This list may not describe all possible interactions. Give your health care provider a list of all the medicines, herbs, non-prescription drugs, or dietary supplements you use. Also tell them if you smoke, drink alcohol, or use illegal drugs. Some items may interact with your medicine. What should I watch for while using this medicine? Visit your doctor or health care professional for regular checkups. It may be some time before you see the benefit from this medicine. Do not stop taking your medicine unless your doctor tells you to. Your doctor may order blood tests or other  tests to see how you are doing. Women should inform their doctor if they wish to become pregnant or think they might be pregnant. There is a potential for serious side effects to an unborn child. Talk to your health care professional or pharmacist for more information. You should make sure that you get enough calcium and vitamin D while you are taking this medicine. Discuss the foods you eat and the vitamins you take with your health care professional. Some people who take this medicine have severe bone, joint, and/or muscle pain. This medicine may also increase your risk for jaw problems or a broken thigh bone. Tell your doctor right away if you have severe pain in your jaw, bones, joints, or muscles. Tell your doctor if you have any pain that does not go away or that gets worse. Tell your dentist and dental surgeon that you are taking this medicine. You should not have major dental surgery while on this medicine. See your dentist to have a dental exam and fix any dental problems before starting this medicine. Take good care of your teeth while on this medicine. Make sure you see your dentist for regular follow-up appointments. What side effects may I notice from receiving this medicine? Side effects that you should report to your doctor or health care professional as soon as possible: -allergic reactions like skin rash, itching or hives, swelling of the face, lips, or tongue -anxiety, confusion, or depression -breathing problems -changes in vision -eye pain -feeling faint or lightheaded, falls -jaw pain, especially after dental work -mouth sores -muscle cramps, stiffness, or weakness -trouble passing urine or change in the amount of urine Side effects that usually do not require medical attention (report to your doctor or health care professional if they continue or are bothersome): -bone, joint, or muscle pain -constipation -diarrhea -fever -hair loss -irritation at site where  injected -loss of appetite -nausea, vomiting -stomach upset -trouble sleeping -trouble swallowing -weak or tired This list may not describe all possible side effects. Call your doctor for medical advice about side effects. You may report side effects to FDA at 1-800-FDA-1088. Where should I keep my medicine? This drug is given in a hospital or clinic and will not be stored at home. NOTE: This sheet is a summary. It may not cover all possible information. If you have questions about this medicine, talk to your doctor, pharmacist, or health care provider.  2015, Elsevier/Gold Standard. (2013-01-02 13:03:13)

## 2015-01-22 NOTE — Telephone Encounter (Signed)
Schedule MRI in [place] [facility] will call pt with appt and time

## 2015-01-24 LAB — URINE CULTURE

## 2015-01-25 ENCOUNTER — Telehealth: Payer: Self-pay | Admitting: *Deleted

## 2015-01-25 ENCOUNTER — Ambulatory Visit (INDEPENDENT_AMBULATORY_CARE_PROVIDER_SITE_OTHER): Payer: 59

## 2015-01-25 DIAGNOSIS — C7951 Secondary malignant neoplasm of bone: Secondary | ICD-10-CM | POA: Diagnosis not present

## 2015-01-25 DIAGNOSIS — C649 Malignant neoplasm of unspecified kidney, except renal pelvis: Secondary | ICD-10-CM

## 2015-01-25 DIAGNOSIS — C651 Malignant neoplasm of right renal pelvis: Secondary | ICD-10-CM

## 2015-01-25 DIAGNOSIS — C787 Secondary malignant neoplasm of liver and intrahepatic bile duct: Principal | ICD-10-CM

## 2015-01-25 MED ORDER — GADOBENATE DIMEGLUMINE 529 MG/ML IV SOLN
20.0000 mL | Freq: Once | INTRAVENOUS | Status: AC | PRN
  Administered 2015-01-25: 18 mL via INTRAVENOUS

## 2015-01-25 NOTE — Telephone Encounter (Signed)
Checked with patient to see how she is doing. She feels better. Her UTI symptoms have greatly improved. She is trying to keep up her water intake and stay hydrated. She states her pain is fairly well controlled, but she is continuing to adjust how she takes her medicine to see what helps her the most.  Notified her, per Dr Marin Olp, that urology should be contacting her to set up an appointment this week.   Overall she is doing better. She knows to call the office if she is having problems or symptoms worsen.

## 2015-01-26 ENCOUNTER — Other Ambulatory Visit: Payer: Self-pay | Admitting: *Deleted

## 2015-01-26 DIAGNOSIS — C659 Malignant neoplasm of unspecified renal pelvis: Secondary | ICD-10-CM

## 2015-01-26 MED ORDER — ONDANSETRON 8 MG PO TBDP: 8.0000 mg | ORAL_TABLET | Freq: Three times a day (TID) | ORAL | Status: AC | PRN

## 2015-01-27 ENCOUNTER — Other Ambulatory Visit: Payer: Self-pay | Admitting: Hematology & Oncology

## 2015-01-27 DIAGNOSIS — C641 Malignant neoplasm of right kidney, except renal pelvis: Secondary | ICD-10-CM

## 2015-01-29 ENCOUNTER — Other Ambulatory Visit: Payer: Self-pay | Admitting: Urology

## 2015-01-29 DIAGNOSIS — C801 Malignant (primary) neoplasm, unspecified: Secondary | ICD-10-CM

## 2015-02-01 ENCOUNTER — Other Ambulatory Visit: Payer: 59

## 2015-02-01 ENCOUNTER — Ambulatory Visit: Payer: 59 | Admitting: Hematology & Oncology

## 2015-02-01 ENCOUNTER — Telehealth: Payer: Self-pay | Admitting: Hematology & Oncology

## 2015-02-01 NOTE — Telephone Encounter (Signed)
Pt is sick needed to resched to 7/1.

## 2015-02-02 ENCOUNTER — Ambulatory Visit (HOSPITAL_BASED_OUTPATIENT_CLINIC_OR_DEPARTMENT_OTHER): Payer: 59 | Admitting: Hematology & Oncology

## 2015-02-02 ENCOUNTER — Encounter: Payer: Self-pay | Admitting: Hematology & Oncology

## 2015-02-02 ENCOUNTER — Emergency Department (HOSPITAL_BASED_OUTPATIENT_CLINIC_OR_DEPARTMENT_OTHER)
Admission: EM | Admit: 2015-02-02 | Discharge: 2015-02-02 | Disposition: A | Discharge status: Home / Self Care | Payer: 59 | Attending: Emergency Medicine | Admitting: Emergency Medicine

## 2015-02-02 ENCOUNTER — Other Ambulatory Visit (HOSPITAL_BASED_OUTPATIENT_CLINIC_OR_DEPARTMENT_OTHER): Payer: 59

## 2015-02-02 ENCOUNTER — Other Ambulatory Visit: Payer: Self-pay

## 2015-02-02 ENCOUNTER — Encounter (HOSPITAL_BASED_OUTPATIENT_CLINIC_OR_DEPARTMENT_OTHER): Payer: Self-pay

## 2015-02-02 ENCOUNTER — Emergency Department (HOSPITAL_BASED_OUTPATIENT_CLINIC_OR_DEPARTMENT_OTHER): Payer: 59

## 2015-02-02 VITALS — BP 115/60 | HR 84 | Temp 97.5°F | Resp 16 | Wt 200.0 lb

## 2015-02-02 DIAGNOSIS — Z7952 Long term (current) use of systemic steroids: Secondary | ICD-10-CM | POA: Insufficient documentation

## 2015-02-02 DIAGNOSIS — Z792 Long term (current) use of antibiotics: Secondary | ICD-10-CM | POA: Diagnosis not present

## 2015-02-02 DIAGNOSIS — C7901 Secondary malignant neoplasm of right kidney and renal pelvis: Secondary | ICD-10-CM | POA: Diagnosis not present

## 2015-02-02 DIAGNOSIS — C641 Malignant neoplasm of right kidney, except renal pelvis: Secondary | ICD-10-CM | POA: Diagnosis not present

## 2015-02-02 DIAGNOSIS — R140 Abdominal distension (gaseous): Secondary | ICD-10-CM | POA: Insufficient documentation

## 2015-02-02 DIAGNOSIS — C649 Malignant neoplasm of unspecified kidney, except renal pelvis: Secondary | ICD-10-CM

## 2015-02-02 DIAGNOSIS — R7989 Other specified abnormal findings of blood chemistry: Secondary | ICD-10-CM | POA: Diagnosis present

## 2015-02-02 DIAGNOSIS — R945 Abnormal results of liver function studies: Secondary | ICD-10-CM

## 2015-02-02 DIAGNOSIS — Z720 Tobacco use: Secondary | ICD-10-CM | POA: Diagnosis not present

## 2015-02-02 DIAGNOSIS — R600 Localized edema: Secondary | ICD-10-CM | POA: Diagnosis not present

## 2015-02-02 DIAGNOSIS — Z79899 Other long term (current) drug therapy: Secondary | ICD-10-CM | POA: Diagnosis not present

## 2015-02-02 HISTORY — DX: Malignant neoplasm of right kidney, except renal pelvis: C64.1

## 2015-02-02 HISTORY — DX: Dorsalgia, unspecified: M54.9

## 2015-02-02 LAB — CBC WITH DIFFERENTIAL (CANCER CENTER ONLY)
BASO#: 0 10*3/uL (ref 0.0–0.2)
BASO%: 0.1 % (ref 0.0–2.0)
EOS%: 0 % (ref 0.0–7.0)
Eosinophils Absolute: 0 10*3/uL (ref 0.0–0.5)
HCT: 30.9 % — ABNORMAL LOW (ref 34.8–46.6)
HEMOGLOBIN: 10 g/dL — AB (ref 11.6–15.9)
LYMPH#: 1.4 10*3/uL (ref 0.9–3.3)
LYMPH%: 5.5 % — ABNORMAL LOW (ref 14.0–48.0)
MCH: 25.6 pg — ABNORMAL LOW (ref 26.0–34.0)
MCHC: 32.4 g/dL (ref 32.0–36.0)
MCV: 79 fL — ABNORMAL LOW (ref 81–101)
MONO#: 1 10*3/uL — AB (ref 0.1–0.9)
MONO%: 3.8 % (ref 0.0–13.0)
NEUT%: 90.6 % — ABNORMAL HIGH (ref 39.6–80.0)
NEUTROS ABS: 23.9 10*3/uL — AB (ref 1.5–6.5)
Platelets: 120 10*3/uL — ABNORMAL LOW (ref 145–400)
RBC: 3.9 10*6/uL (ref 3.70–5.32)
RDW: 18.5 % — AB (ref 11.1–15.7)
WBC: 26.4 10*3/uL — ABNORMAL HIGH (ref 3.9–10.0)

## 2015-02-02 LAB — CBC WITH DIFFERENTIAL/PLATELET
BAND NEUTROPHILS: 2 % (ref 0–10)
Basophils Absolute: 0 10*3/uL (ref 0.0–0.1)
Basophils Relative: 0 % (ref 0–1)
Eosinophils Absolute: 0 10*3/uL (ref 0.0–0.7)
Eosinophils Relative: 0 % (ref 0–5)
HCT: 31.4 % — ABNORMAL LOW (ref 36.0–46.0)
Hemoglobin: 9.9 g/dL — ABNORMAL LOW (ref 12.0–15.0)
Lymphocytes Relative: 6 % — ABNORMAL LOW (ref 12–46)
Lymphs Abs: 1.5 10*3/uL (ref 0.7–4.0)
MCH: 24.9 pg — ABNORMAL LOW (ref 26.0–34.0)
MCHC: 31.5 g/dL (ref 30.0–36.0)
MCV: 78.9 fL (ref 78.0–100.0)
MONO ABS: 1 10*3/uL (ref 0.1–1.0)
Monocytes Relative: 4 % (ref 3–12)
Myelocytes: 1 %
Neutro Abs: 22.2 10*3/uL — ABNORMAL HIGH (ref 1.7–7.7)
Neutrophils Relative %: 87 % — ABNORMAL HIGH (ref 43–77)
Platelets: 116 10*3/uL — ABNORMAL LOW (ref 150–400)
RBC: 3.98 MIL/uL (ref 3.87–5.11)
RDW: 18.8 % — AB (ref 11.5–15.5)
SMEAR REVIEW: DECREASED
WBC: 24.7 10*3/uL — ABNORMAL HIGH (ref 4.0–10.5)

## 2015-02-02 LAB — CMP (CANCER CENTER ONLY)
ALT(SGPT): 301 U/L (ref 10–47)
Albumin: 2.5 g/dL — ABNORMAL LOW (ref 3.3–5.5)
Alkaline Phosphatase: 297 U/L — ABNORMAL HIGH (ref 26–84)
BUN: 22 mg/dL (ref 7–22)
CHLORIDE: 103 meq/L (ref 98–108)
CO2: 22 meq/L (ref 18–33)
CREATININE: 0.8 mg/dL (ref 0.6–1.2)
Calcium: 9.4 mg/dL (ref 8.0–10.3)
Glucose, Bld: 121 mg/dL — ABNORMAL HIGH (ref 73–118)
Potassium: 5.7 mEq/L — ABNORMAL HIGH (ref 3.3–4.7)
Sodium: 133 mEq/L (ref 128–145)
Total Bilirubin: 0.6 mg/dl (ref 0.20–1.60)
Total Protein: 6.3 g/dL — ABNORMAL LOW (ref 6.4–8.1)

## 2015-02-02 LAB — COMPREHENSIVE METABOLIC PANEL
ALT: 297 U/L — AB (ref 14–54)
AST: 1240 U/L — ABNORMAL HIGH (ref 15–41)
Albumin: 2.5 g/dL — ABNORMAL LOW (ref 3.5–5.0)
Alkaline Phosphatase: 311 U/L — ABNORMAL HIGH (ref 38–126)
Anion gap: 13 (ref 5–15)
BUN: 27 mg/dL — AB (ref 6–20)
CO2: 20 mmol/L — AB (ref 22–32)
CREATININE: 0.85 mg/dL (ref 0.44–1.00)
Calcium: 9 mg/dL (ref 8.9–10.3)
Chloride: 99 mmol/L — ABNORMAL LOW (ref 101–111)
GFR calc non Af Amer: >60 mL/min (ref >60)
GLUCOSE: 148 mg/dL — AB (ref 65–99)
Potassium: 5.1 mmol/L (ref 3.5–5.1)
Sodium: 132 mmol/L — ABNORMAL LOW (ref 135–145)
Total Bilirubin: 0.3 mg/dL (ref 0.3–1.2)
Total Protein: 6.4 g/dL — ABNORMAL LOW (ref 6.5–8.1)

## 2015-02-02 LAB — URINALYSIS, ROUTINE W REFLEX MICROSCOPIC
BILIRUBIN URINE: NEGATIVE
GLUCOSE, UA: NEGATIVE mg/dL
Hgb urine dipstick: NEGATIVE
KETONES UR: NEGATIVE mg/dL
Leukocytes, UA: NEGATIVE
Nitrite: NEGATIVE
PH: 6 (ref 5.0–8.0)
Protein, ur: NEGATIVE mg/dL
Specific Gravity, Urine: 1.023 (ref 1.005–1.030)
Urobilinogen, UA: 0.2 mg/dL (ref 0.0–1.0)

## 2015-02-02 LAB — TROPONIN I: Troponin I: <0.03 ng/mL (ref <0.031)

## 2015-02-02 LAB — I-STAT CG4 LACTIC ACID, ED: Lactic Acid, Venous: 3.49 mmol/L (ref 0.5–2.0)

## 2015-02-02 LAB — TECHNOLOGIST REVIEW CHCC SATELLITE: Tech Review: 2

## 2015-02-02 MED ORDER — FUROSEMIDE 10 MG/ML IJ SOLN
40.0000 mg | Freq: Once | INTRAMUSCULAR | Status: AC
  Administered 2015-02-02: 40 mg via INTRAVENOUS
  Filled 2015-02-02: qty 4

## 2015-02-02 MED ORDER — SODIUM CHLORIDE 0.9 % IV BOLUS (SEPSIS)
1000.0000 mL | Freq: Once | INTRAVENOUS | Status: AC
  Administered 2015-02-02: 1000 mL via INTRAVENOUS

## 2015-02-02 MED ORDER — ONDANSETRON HCL 4 MG/2ML IJ SOLN
4.0000 mg | Freq: Once | INTRAMUSCULAR | Status: DC
  Filled 2015-02-02: qty 2

## 2015-02-02 MED ORDER — FUROSEMIDE 20 MG PO TABS: 20.0000 mg | ORAL_TABLET | Freq: Two times a day (BID) | ORAL | Status: AC

## 2015-02-02 NOTE — ED Provider Notes (Signed)
2:37 PM  ED ECG REPORT   Date: 02/02/2015  Rate: 76  Rhythm: normal sinus rhythm  QRS Axis: normal  Intervals: normal  ST/T Wave abnormalities: normal  Conduction Disutrbances:none  Narrative Interpretation: poor r wave progession  Old EKG Reviewed: none available    Alfonzo Beers, MD 02/02/15 1438

## 2015-02-02 NOTE — ED Notes (Signed)
Patient placed on cardiac monitor.

## 2015-02-02 NOTE — ED Notes (Signed)
Pt sent from PCP office in he building-report to MD pt with hyperkalemia-pt A/O-steady gait

## 2015-02-02 NOTE — Discharge Instructions (Signed)
Edema  Edema is an abnormal buildup of fluids. It is more common in your legs and thighs. Painless swelling of the feet and ankles is more likely as a person ages. It also is common in looser skin, like around your eyes.  HOME CARE   · Keep the affected body part above the level of the heart while lying down.  · Do not sit still or stand for a long time.  · Do not put anything right under your knees when you lie down.  · Do not wear tight clothes on your upper legs.  · Exercise your legs to help the puffiness (swelling) go down.  · Wear elastic bandages or support stockings as told by your doctor.  · A low-salt diet may help lessen the puffiness.  · Only take medicine as told by your doctor.  GET HELP IF:  · Treatment is not working.  · You have heart, liver, or kidney disease and notice that your skin looks puffy or shiny.  · You have puffiness in your legs that does not get better when you raise your legs.  · You have sudden weight gain for no reason.  GET HELP RIGHT AWAY IF:   · You have shortness of breath or chest pain.  · You cannot breathe when you lie down.  · You have pain, redness, or warmth in the areas that are puffy.  · You have heart, liver, or kidney disease and get edema all of a sudden.  · You have a fever and your symptoms get worse all of a sudden.  MAKE SURE YOU:   · Understand these instructions.  · Will watch your condition.  · Will get help right away if you are not doing well or get worse.  Document Released: 01/10/2008 Document Revised: 07/29/2013 Document Reviewed: 05/16/2013  ExitCare® Patient Information ©2015 [employer]. This information is not intended to replace advice given to you by your health care provider. Make sure you discuss any questions you have with your health care provider.

## 2015-02-02 NOTE — Progress Notes (Signed)
Hematology and Oncology Follow Up Visit  Maria Armstrong [id number] 02-17-1961 54 y.o. 02/02/2015   Principle Diagnosis:   Metastatic renal cell carcinoma-histology not yet known  Current Therapy:    Observation     Interim History:  Maria Armstrong is back for follow-up. She still having a tough time. She did see urology. They do not feel that she was a candidate for a palliative nephrectomy for the right kidney.  She is still having problems with fatigue and weakness. She's still having some fevers and sweats. I'm sure that this is probably from her hepatic metastasis.  We have to get a biopsy on her. I have know whether or not this is clear-cell histology or sarcomatoid histology.  She looks better today than when I first saw her. We have given her fluids. She was hyper calcemic we first saw her. She had a urinary tract infection that we treated. She had Klebsiella in her urine.  Her LDH we first saw her was almost 3000. I think this is reflective of her hepatic disease.  Her prealbumin was only 7.  Her appetite might be a little bit better.  Her pain control seems to be a little bit better. She is constipated. Lactulose seems to help.  There's been no bleeding. She's had no headaches. There's been no visual issues.  Medications:  Current outpatient prescriptions:  .  alprazolam (XANAX) 2 MG tablet, Take 2 mg by mouth at bedtime as needed. , Disp: , Rfl:  .  amoxicillin-clavulanate (AUGMENTIN) 875-125 MG per tablet, Take 1 tablet by mouth 2 (two) times daily., Disp: 60 tablet, Rfl: 3 .  buPROPion (WELLBUTRIN SR) 150 MG 12 hr tablet, Take 150 mg by mouth 2 (two) times daily., Disp: , Rfl:  .  dexamethasone (DECADRON) 4 MG tablet, Take 3 tablets (12 mg total) by mouth daily. Take with food, Disp: 90 tablet, Rfl: 3 .  lactulose (CHRONULAC) 10 GM/15ML solution, Take by mouth daily as needed for mild constipation., Disp: , Rfl:  .  metoCLOPramide (REGLAN) 10 MG tablet, Take 10 mg by mouth  every 8 (eight) hours as needed for nausea., Disp: , Rfl:  .  ondansetron (ZOFRAN-ODT) 8 MG disintegrating tablet, Take 1 tablet (8 mg total) by mouth every 8 (eight) hours as needed for nausea or vomiting., Disp: 90 tablet, Rfl: 3 .  OxyCODONE (OXYCONTIN) 40 mg T12A 12 hr tablet, Take 1 tablet (40 mg total) by mouth every 12 (twelve) hours., Disp: 60 tablet, Rfl: 0 .  oxyCODONE 20 MG TABS, Take 1/2-1 pill, if needed, every 4-6 hours for pain secondary to cancer., Disp: 90 tablet, Rfl: 0 .  fluconazole (DIFLUCAN) 100 MG tablet, Take 1 tablet (100 mg total) by mouth daily. (Patient not taking: Reported on 02/02/2015), Disp: 30 tablet, Rfl: 3 .  pregabalin (LYRICA) 75 MG capsule, Take 75 mg by mouth 2 (two) times daily., Disp: , Rfl:  .  saccharomyces boulardii (FLORASTOR) 250 MG capsule, Take 250 mg by mouth every morning., Disp: , Rfl:   Allergies:  Allergies  Allergen Reactions  . Morphine And Related Nausea And Vomiting    Past Medical History, Surgical history, Social history, and Family History were reviewed and updated.  Review of Systems: As above  Physical Exam:  weight is 200 lb (90.719 kg). Her oral temperature is 97.5 F (36.4 C). Her blood pressure is 115/60 and her pulse is 84. Her respiration is 16.   Wt Readings from Last 3 Encounters:  02/02/15 200  lb (90.719 kg)  02/02/15 200 lb (90.719 kg)  01/25/15 197 lb (89.359 kg)     Somewhat ill-appearing white female in no obvious distress. Head and neck exam shows no ocular or oral lesions. Her pupils react appropriately. She has no thrush. There is no adenopathy in the neck. Lungs are clear bilaterally. No rales, wheezes or rhonchi are noted. Cardiac exam regular rate and rhythm with no murmurs, rubs or bruits. Abdomen is soft. Her liver edge is palpable just below the right costal margin. There is no fluid wave. There is no splenomegaly. Back exam shows some slight tenderness throughout the spine. Extremities shows some 2+  edema in her lower legs. Neurological exam is nonfocal.  Lab Results  Component Value Date   WBC 26.4* 02/02/2015   HGB 10.0* 02/02/2015   HCT 30.9* 02/02/2015   MCV 79* 02/02/2015   PLT 120* 02/02/2015     Chemistry      Component Value Date/Time   NA 133 02/02/2015 1126   NA 143 08/15/2010 1350   K 5.7* 02/02/2015 1126   K 3.7 08/15/2010 1350   CL 103 02/02/2015 1126   CL 105 08/15/2010 1350   CO2 22 02/02/2015 1126   CO2 28 08/15/2010 1350   BUN 22 02/02/2015 1126   BUN 11 08/15/2010 1350   CREATININE 0.8 02/02/2015 1126   CREATININE 0.86 08/15/2010 1350      Component Value Date/Time   CALCIUM 9.4 02/02/2015 1126   CALCIUM 10.1 08/15/2010 1350   ALKPHOS 297* 02/02/2015 1126   AST 1,275* 02/02/2015 1126   ALT 301* 02/02/2015 1126   BILITOT 0.60 02/02/2015 1126         Impression and Plan: Maria Armstrong is 54 year old white female with widely metastatic renal cell carcinoma. Again, we don't know the histology. I much or if we really are going be able to have the luxury of getting a biopsy.  I think the priority right now is her potassium. I think this is reflective of what is going on in her liver. I think that she just has a high tumor burden that is going be very tough to treat.  Thankfully, she is not hypercalcemic. Her renal function is okay.  I want to try to use Votrient but with her liver function studies, I just don't think I can. She is taking a lot of natural agents that her family is given her.  We are going to have to get her down to the emergency room. She probably will not to be on a monitor. I am sure that she will be admitted.  This is just a very difficult problem that she has. With her prealbumin being so low, is going to be very difficult to try to treat her aggressively.  I am sure that I will see her in the hospital.   Volanda Napoleon, MD 6/28/201612:48 PM

## 2015-02-02 NOTE — ED Provider Notes (Signed)
CSN: [id number]     Arrival date & time 02/02/15  1231 History   First MD Initiated Contact with Patient 02/02/15 1250     Chief Complaint  Patient presents with  . Abnormal Lab     (Consider location/radiation/quality/duration/timing/severity/associated sxs/prior Treatment) HPI Comments: Pt states that she comes because she was sent in from oncology for a high potassium. Pt states that she was diagnosed with stage 4 metastatic renal cell about 1 month ago. She has not had any chemo yet. She states that over the last week she has developed swelling in her feet and she is sob. She states that she is generally week without any pain. She states that she normally takes lasix but she hasn't for the last couple of days.  The history is provided by the patient. No language interpreter was used.    Past Medical History  Diagnosis Date  . Cancer   . Back pain   . Renal cell carcinoma of right kidney 02/02/2015   Past Surgical History  Procedure Laterality Date  . Oophorectomy    . Insertion of mesh    . Appendectomy    . Tonsillectomy    . Abdominal hysterectomy     No family history on file. History  Substance Use Topics  . Smoking status: Current Every Day Smoker -- 0.50 packs/day for 15 years    Types: Cigarettes  . Smokeless tobacco: Never Used  . Alcohol Use: No   OB History    No data available     Review of Systems  All other systems reviewed and are negative.     Allergies  Morphine and related  Home Medications   Prior to Admission medications   Medication Sig Start Date End Date Taking? Authorizing Provider  alprazolam Duanne Moron) 2 MG tablet Take 2 mg by mouth at bedtime as needed.     Historical Provider, MD  amoxicillin-clavulanate (AUGMENTIN) 875-125 MG per tablet Take 1 tablet by mouth 2 (two) times daily. 01/22/15   Eliezer Bottom, NP  buPROPion [facility] SR) 150 MG 12 hr tablet Take 150 mg by mouth 2 (two) times daily.    Historical Provider, MD   dexamethasone (DECADRON) 4 MG tablet Take 3 tablets (12 mg total) by mouth daily. Take with food 01/22/15   Eliezer Bottom, NP  fluconazole (DIFLUCAN) 100 MG tablet Take 1 tablet (100 mg total) by mouth daily. Patient not taking: Reported on 02/02/2015 01/22/15   Eliezer Bottom, NP  lactulose [facility]) 10 GM/15ML solution Take by mouth daily as needed for mild constipation.    Historical Provider, MD  metoCLOPramide (REGLAN) 10 MG tablet Take 10 mg by mouth every 8 (eight) hours as needed for nausea.    Historical Provider, MD  ondansetron (ZOFRAN-ODT) 8 MG disintegrating tablet Take 1 tablet (8 mg total) by mouth every 8 (eight) hours as needed for nausea or vomiting. 01/26/15   Volanda Napoleon, MD  OxyCODONE (OXYCONTIN) 40 mg T12A 12 hr tablet Take 1 tablet (40 mg total) by mouth every 12 (twelve) hours. 01/22/15   Volanda Napoleon, MD  oxyCODONE 20 MG TABS Take 1/2-1 pill, if needed, every 4-6 hours for pain secondary to cancer. 01/22/15   Volanda Napoleon, MD  pregabalin (LYRICA) 75 MG capsule Take 75 mg by mouth 2 (two) times daily.    Historical Provider, MD  saccharomyces boulardii (FLORASTOR) 250 MG capsule Take 250 mg by mouth every morning.    Historical Provider, MD  BP 129/54 mmHg  Pulse 80  Temp(Src) 97.6 F (36.4 C) (Oral)  Resp 18  Ht 5\' 4"  (1.626 m)  Wt 200 lb (90.719 kg)  BMI 34.31 kg/m2  SpO2 98% Physical Exam  Constitutional: She is oriented to person, place, and time. She appears well-developed and well-nourished.  Cardiovascular: Normal rate and regular rhythm.   Pulmonary/Chest: Effort normal and breath sounds normal.  Abdominal: Soft. She exhibits distension. There is no tenderness.  Musculoskeletal: Normal range of motion. She exhibits edema.  Bilateral lower extremity edema  Neurological: She is alert and oriented to person, place, and time. Coordination normal.  Skin: Skin is warm and dry.  Psychiatric: She has a normal mood and affect.  Nursing note and  vitals reviewed.   ED Course  Procedures (including critical care time) Labs Review Labs Reviewed  COMPREHENSIVE METABOLIC PANEL - Abnormal; Notable for the following:    Sodium 132 (*)    Chloride 99 (*)    CO2 20 (*)    Glucose, Bld 148 (*)    BUN 27 (*)    Total Protein 6.4 (*)    Albumin 2.5 (*)    AST 1240 (*)    ALT 297 (*)    Alkaline Phosphatase 311 (*)    All other components within normal limits  CBC WITH DIFFERENTIAL/PLATELET - Abnormal; Notable for the following:    WBC 24.7 (*)    Hemoglobin 9.9 (*)    HCT 31.4 (*)    MCH 24.9 (*)    RDW 18.8 (*)    Platelets 116 (*)    Neutrophils Relative % 87 (*)    Lymphocytes Relative 6 (*)    Neutro Abs 22.2 (*)    All other components within normal limits  I-STAT CG4 LACTIC ACID, ED - Abnormal; Notable for the following:    Lactic Acid, Venous 3.49 (*)    All other components within normal limits  TROPONIN I  URINALYSIS, ROUTINE W REFLEX MICROSCOPIC (NOT AT [facility])    Imaging Review Dg Chest 2 View  02/02/2015   CLINICAL DATA:  Hyperkalemia. History of right-sided renal cell carcinoma  EXAM: CHEST  2 VIEW  COMPARISON:  Chest radiograph July 14, 2014 and chest CT January 21, 2015  FINDINGS: Scattered subsegmental nodular opacities are noted bilaterally, better seen on CT. The degree of inspiration is shallow. There is mild atelectasis in the right mid lung and left base regions. There is no edema or consolidation. The heart size and pulmonary vascularity are normal. No adenopathy. No bone lesions.  IMPRESSION: Small nodular opacities are noted in the lungs, better seen on recent CT. Small metastases must be of concern given the clinical history. No edema or consolidation. Mild atelectasis right mid lung and left base regions.   Electronically Signed   By: Lowella Grip III M.D.   On: 02/02/2015 13:36     EKG Interpretation None      MDM   Final diagnoses:  Bilateral edema of lower extremity  Metastatic renal  cell carcinoma, unspecified laterality  Elevated LFTs    Pt given lasix here. Discussed case with Dr. Canary Brim. Pt is wanting to go home and think that is reasonable at this time as her potasium is not elevated and she is feeling a little better with fluids. We discussed with pt return precautions. Will give lasix for the next couple of days until she goes home. She is going to call oncology tomorrow.      Glendell Docker,  NP 02/02/15 2215  Alfonzo Beers, MD 02/05/15 1525

## 2015-02-03 ENCOUNTER — Other Ambulatory Visit: Payer: Self-pay | Admitting: Hematology & Oncology

## 2015-02-03 ENCOUNTER — Telehealth: Payer: Self-pay | Admitting: *Deleted

## 2015-02-03 DIAGNOSIS — N2889 Other specified disorders of kidney and ureter: Secondary | ICD-10-CM

## 2015-02-03 LAB — LACTATE DEHYDROGENASE

## 2015-02-03 LAB — PREALBUMIN: Prealbumin: 14 mg/dL — ABNORMAL LOW (ref 17–34)

## 2015-02-03 NOTE — Telephone Encounter (Signed)
Called patient to follow up after her visit to the office and ED yesterday. Patient states she is feeling much better today. Her legs are "half the size" they were yesterday. Her pain is being controlled with her prescription medications. She has another appointment with this office on Friday, June 1st. Notified Dr Marin Olp

## 2015-02-04 ENCOUNTER — Other Ambulatory Visit: Payer: Self-pay | Admitting: *Deleted

## 2015-02-04 DIAGNOSIS — C641 Malignant neoplasm of right kidney, except renal pelvis: Secondary | ICD-10-CM

## 2015-02-05 ENCOUNTER — Encounter: Payer: Self-pay | Admitting: Hematology & Oncology

## 2015-02-05 ENCOUNTER — Ambulatory Visit (HOSPITAL_BASED_OUTPATIENT_CLINIC_OR_DEPARTMENT_OTHER): Payer: 59 | Admitting: Hematology & Oncology

## 2015-02-05 ENCOUNTER — Ambulatory Visit: Payer: 59 | Admitting: Hematology & Oncology

## 2015-02-05 ENCOUNTER — Other Ambulatory Visit: Payer: 59

## 2015-02-05 ENCOUNTER — Telehealth: Payer: Self-pay | Admitting: *Deleted

## 2015-02-05 ENCOUNTER — Other Ambulatory Visit (HOSPITAL_BASED_OUTPATIENT_CLINIC_OR_DEPARTMENT_OTHER): Payer: 59

## 2015-02-05 VITALS — BP 101/42 | HR 84 | Temp 97.5°F | Resp 16 | Ht 64.0 in | Wt 196.0 lb

## 2015-02-05 DIAGNOSIS — C787 Secondary malignant neoplasm of liver and intrahepatic bile duct: Secondary | ICD-10-CM

## 2015-02-05 DIAGNOSIS — C649 Malignant neoplasm of unspecified kidney, except renal pelvis: Secondary | ICD-10-CM | POA: Diagnosis not present

## 2015-02-05 DIAGNOSIS — C641 Malignant neoplasm of right kidney, except renal pelvis: Secondary | ICD-10-CM

## 2015-02-05 LAB — CBC WITH DIFFERENTIAL (CANCER CENTER ONLY)
BASO#: 0 10*3/uL (ref 0.0–0.2)
BASO%: 0.1 % (ref 0.0–2.0)
EOS ABS: 0.1 10*3/uL (ref 0.0–0.5)
EOS%: 0.2 % (ref 0.0–7.0)
HCT: 31.1 % — ABNORMAL LOW (ref 34.8–46.6)
HGB: 9.9 g/dL — ABNORMAL LOW (ref 11.6–15.9)
LYMPH#: 3.5 10*3/uL — ABNORMAL HIGH (ref 0.9–3.3)
LYMPH%: 11 % — ABNORMAL LOW (ref 14.0–48.0)
MCH: 25.3 pg — ABNORMAL LOW (ref 26.0–34.0)
MCHC: 31.8 g/dL — ABNORMAL LOW (ref 32.0–36.0)
MCV: 79 fL — ABNORMAL LOW (ref 81–101)
MONO#: 1.4 10*3/uL — AB (ref 0.1–0.9)
MONO%: 4.3 % (ref 0.0–13.0)
NEUT#: 26.6 10*3/uL — ABNORMAL HIGH (ref 1.5–6.5)
NEUT%: 84.4 % — ABNORMAL HIGH (ref 39.6–80.0)
Platelets: 106 10*3/uL — ABNORMAL LOW (ref 145–400)
RBC: 3.92 10*6/uL (ref 3.70–5.32)
RDW: 18.7 % — ABNORMAL HIGH (ref 11.1–15.7)
WBC: 31.6 10*3/uL — AB (ref 3.9–10.0)

## 2015-02-05 LAB — CMP (CANCER CENTER ONLY)
ALT: 227 U/L — AB (ref 10–47)
AST: 922 U/L — AB (ref 11–38)
Albumin: 2.7 g/dL — ABNORMAL LOW (ref 3.3–5.5)
Alkaline Phosphatase: 320 U/L — ABNORMAL HIGH (ref 26–84)
BUN, Bld: 29 mg/dL — ABNORMAL HIGH (ref 7–22)
CALCIUM: 9.4 mg/dL (ref 8.0–10.3)
CHLORIDE: 97 meq/L — AB (ref 98–108)
CO2: 25 meq/L (ref 18–33)
CREATININE: 1.2 mg/dL (ref 0.6–1.2)
GLUCOSE: 121 mg/dL — AB (ref 73–118)
POTASSIUM: 5.9 meq/L — AB (ref 3.3–4.7)
Sodium: 131 mEq/L (ref 128–145)
TOTAL PROTEIN: 6.1 g/dL — AB (ref 6.4–8.1)
Total Bilirubin: 0.7 mg/dl (ref 0.20–1.60)

## 2015-02-05 LAB — LACTATE DEHYDROGENASE

## 2015-02-05 MED ORDER — SUNITINIB MALATE 25 MG PO CAPS: 25.0000 mg | ORAL_CAPSULE | Freq: Every day | ORAL | Status: AC

## 2015-02-05 MED ORDER — ALPRAZOLAM 2 MG PO TABS: 2.0000 mg | ORAL_TABLET | Freq: Three times a day (TID) | ORAL | Status: AC | PRN

## 2015-02-05 MED ORDER — DRONABINOL 5 MG PO CAPS: 5.0000 mg | ORAL_CAPSULE | Freq: Two times a day (BID) | ORAL | Status: AC

## 2015-02-05 MED ORDER — OXYCODONE HCL 30 MG PO TABS: 30.0000 mg | ORAL_TABLET | ORAL | Status: AC | PRN

## 2015-02-05 NOTE — Telephone Encounter (Signed)
Critical Value AST 922 Dr Marin Olp notified. No orders at this time

## 2015-02-05 NOTE — Progress Notes (Signed)
Hematology and Oncology Follow Up Visit  LYRICK WORLAND [id number] 11-02-1960 54 y.o. 02/05/2015   Principle Diagnosis:   Metastatic renal cell carcinoma-histology not yet known  Current Therapy:    Observation     Interim History:  Ms. Heckstall is back for follow-up. We last saw her on Tuesday. Her potassium was 5.7. We had a good out of the emergency room. They gave her fluids. They gave her some Lasix. Her potassium was down to 5.1. She went home.  She is doing a little bit better. She is still having significant pain. She is on oxycodone and OxyContin. I'll have to increase the oxycodone a little bit. I will try her on 30 mg dose.  Lactulose seems to help with her constipation.  She might be eating a little bit better.  She was scheduled for a biopsy next Thursday. We will move this up to Tuesday. I really have to know what kind of renal cell carcinoma this is.  I had a really long talk with she and her sister. I explained to her that if we cannot get this cancer undercontrol soon, then I would not think that she would make it a month. I'm really worried that her liver is going to continue to have stress. The more her liver is stressed, this will affect other parts of her body.  I told her that even with treatment, we still are going be looking at a very tough battle.  I am going to try to put her on Sutent. I don't think she would be able to qualify for Votrient given her liver function studies. She is not a candidate for any immunotherapy as this is second line therapy.  She is not an operative candidate for a palliative nephrectomy.  There is no bleeding. She is still smoking but only a little bit.   Overall, her performance status is ECOG 2.  Medications:  Current outpatient prescriptions:  .  alprazolam (XANAX) 2 MG tablet, Take 1 tablet (2 mg total) by mouth 3 (three) times daily as needed., Disp: 60 tablet, Rfl: 0 .  buPROPion (WELLBUTRIN SR) 150 MG 12 hr tablet, Take 150 mg  by mouth 2 (two) times daily., Disp: , Rfl:  .  dexamethasone (DECADRON) 4 MG tablet, Take 3 tablets (12 mg total) by mouth daily. Take with food, Disp: 90 tablet, Rfl: 3 .  fluconazole (DIFLUCAN) 100 MG tablet, Take 1 tablet (100 mg total) by mouth daily., Disp: 30 tablet, Rfl: 3 .  furosemide (LASIX) 20 MG tablet, Take 1 tablet (20 mg total) by mouth 2 (two) times daily., Disp: 15 tablet, Rfl: 0 .  lactulose (CHRONULAC) 10 GM/15ML solution, Take by mouth daily as needed for mild constipation., Disp: , Rfl:  .  metoCLOPramide (REGLAN) 10 MG tablet, Take 10 mg by mouth every 8 (eight) hours as needed for nausea., Disp: , Rfl:  .  ondansetron (ZOFRAN-ODT) 8 MG disintegrating tablet, Take 1 tablet (8 mg total) by mouth every 8 (eight) hours as needed for nausea or vomiting., Disp: 90 tablet, Rfl: 3 .  OxyCODONE (OXYCONTIN) 40 mg T12A 12 hr tablet, Take 1 tablet (40 mg total) by mouth every 12 (twelve) hours., Disp: 60 tablet, Rfl: 0 .  pregabalin (LYRICA) 75 MG capsule, Take 75 mg by mouth 2 (two) times daily., Disp: , Rfl:  .  saccharomyces boulardii (FLORASTOR) 250 MG capsule, Take 250 mg by mouth every morning., Disp: , Rfl:  .  dronabinol (MARINOL) 5 MG capsule,  Take 1 capsule (5 mg total) by mouth 2 (two) times daily before lunch and supper., Disp: 60 capsule, Rfl: 2 .  oxycodone (ROXICODONE) 30 MG immediate release tablet, Take 1 tablet (30 mg total) by mouth every 4 (four) hours as needed for pain., Disp: 90 tablet, Rfl: 0 .  SUNItinib (SUTENT) 25 MG capsule, Take 1 capsule (25 mg total) by mouth daily., Disp: 30 capsule, Rfl: 2  Allergies:  Allergies  Allergen Reactions  . Morphine And Related Nausea And Vomiting    Past Medical History, Surgical history, Social history, and Family History were reviewed and updated.  Review of Systems: As above  Physical Exam:  height is 5\' 4"  (1.626 m) and weight is 196 lb (88.905 kg). Her oral temperature is 97.5 F (36.4 C). Her blood pressure is  101/42 and her pulse is 84. Her respiration is 16.   Wt Readings from Last 3 Encounters:  02/05/15 196 lb (88.905 kg)  02/02/15 200 lb (90.719 kg)  02/02/15 200 lb (90.719 kg)     Somewhat ill-appearing white female in no obvious distress. Head and neck exam shows no ocular or oral lesions. Her pupils react appropriately. She has no thrush. There is no adenopathy in the neck. Lungs are clear bilaterally. No rales, wheezes or rhonchi are noted. Cardiac exam regular rate and rhythm with no murmurs, rubs or bruits. Abdomen is soft. Her liver edge is palpable just below the right costal margin. There is no fluid wave. There is no splenomegaly. Back exam shows some slight tenderness throughout the spine. Extremities shows some 2+ edema in her lower legs. Neurological exam is nonfocal.  Lab Results  Component Value Date   WBC 31.6* 02/05/2015   HGB 9.9* 02/05/2015   HCT 31.1* 02/05/2015   MCV 79* 02/05/2015   PLT 106* 02/05/2015     Chemistry      Component Value Date/Time   NA 131 02/05/2015 0950   NA 132* 02/02/2015 1320   K 5.9* 02/05/2015 0950   K 5.1 02/02/2015 1320   CL 97* 02/05/2015 0950   CL 99* 02/02/2015 1320   CO2 25 02/05/2015 0950   CO2 20* 02/02/2015 1320   BUN 29* 02/05/2015 0950   BUN 27* 02/02/2015 1320   CREATININE 1.2 02/05/2015 0950   CREATININE 0.85 02/02/2015 1320      Component Value Date/Time   CALCIUM 9.4 02/05/2015 0950   CALCIUM 9.0 02/02/2015 1320   ALKPHOS 320* 02/05/2015 0950   ALKPHOS 311* 02/02/2015 1320   AST 922* 02/05/2015 0950   AST 1240* 02/02/2015 1320   ALT 227* 02/05/2015 0950   ALT 297* 02/02/2015 1320   BILITOT 0.70 02/05/2015 0950   BILITOT 0.3 02/02/2015 1320         Impression and Plan: Ms. Noy is 54 year old white female with widely metastatic renal cell carcinoma. Again, we don't know the histology. I much or if we really are going be able to have the luxury of getting a biopsy.  I am glad that her liver function tests  are a little bit better. Her potassium is a little higher. I don't think this is a problem right now. I told her to watch out with what she eats my containing high cost traces of potassium. I told her to double up on the lactulose. She will continue the Lasix.  I hope that she gets the biopsy on Tuesday. I want to see her back on Wednesday. I would like to think that results will  be back next week.  Her Sutent dose is 25 mg. I want to cut the dose back because of her liver function abnormalities.  I spent over an hour with her. We had a very good prayer session. Her faith is still quite strong. I know this is very overwhelming for her.   Volanda Napoleon, MD 7/1/20163:33 PM

## 2015-02-08 ENCOUNTER — Other Ambulatory Visit: Payer: Self-pay | Admitting: Radiology

## 2015-02-09 ENCOUNTER — Telehealth: Payer: Self-pay | Admitting: *Deleted

## 2015-02-09 ENCOUNTER — Ambulatory Visit (HOSPITAL_COMMUNITY): Admission: RE | Admit: 2015-02-09 | Payer: 59 | Source: Ambulatory Visit

## 2015-02-09 NOTE — Telephone Encounter (Signed)
Following up with patient after several calls to the on-call service this weekend, as well as a message left on our office voice mail regarding patient not wanting any further treatment.  Initially spoke to patient's sister. She states patient has rapidly declined and isn't eating or drinking. She is too weak to leave the bed. They have cancelled the biopsy for today and patient wishes for hospice. Reviewed hospice path with the sister and she understands what this choice means. I then asked to speak to the patient who was sleeping. Patient came to the phone and confirmed everything the sister had said. Patient wants to be a DNR; she doesn't want any treatment; she wants hospice to brought in. She also states she wants to go to a facility as she is certain that she does not want to die at home.   Patient resides in [place]. Spoke to Dr Marin Olp who is fine with making a hospice referral.  Family had already made contact with Lynelle Doctor at [facility] in [place] and would like referral made to him. Spoke to [place] at [phone] and will send all needed documents via fax.

## 2015-02-10 ENCOUNTER — Other Ambulatory Visit: Payer: 59 | Admitting: Nurse Practitioner

## 2015-02-10 ENCOUNTER — Ambulatory Visit: Payer: 59

## 2015-02-10 ENCOUNTER — Ambulatory Visit: Payer: 59 | Admitting: Hematology & Oncology

## 2015-02-11 ENCOUNTER — Ambulatory Visit (HOSPITAL_COMMUNITY): Payer: 59

## 2015-02-12 ENCOUNTER — Telehealth: Payer: Self-pay | Admitting: *Deleted

## 2015-02-12 NOTE — Telephone Encounter (Signed)
Called to check on patient after her decision to stop treatment and enroll in hospice earlier this week. No answer on either line. Voice message left.

## 2015-02-22 NOTE — Progress Notes (Signed)
Although phone calls to patient/family have not been returned to verify, [facility] in [place] posted an obituary for pt stating that pt passed away at [facility] in [place] on February 18, 2015. Dr Marin Olp aware. dph

## 2015-03-08 DEATH — deceased

## 2016-10-01 IMAGING — CR DG CHEST 2V
2 series · 2 of 2 positions shown · non-contrast
Comparison: 08/15/2010

CLINICAL DATA: Slight dyspnea on exertion, multiple recent
pneumonias, evaluate for resolution

EXAM:
CHEST  2 VIEW

[view not recorded (1 of 2)]
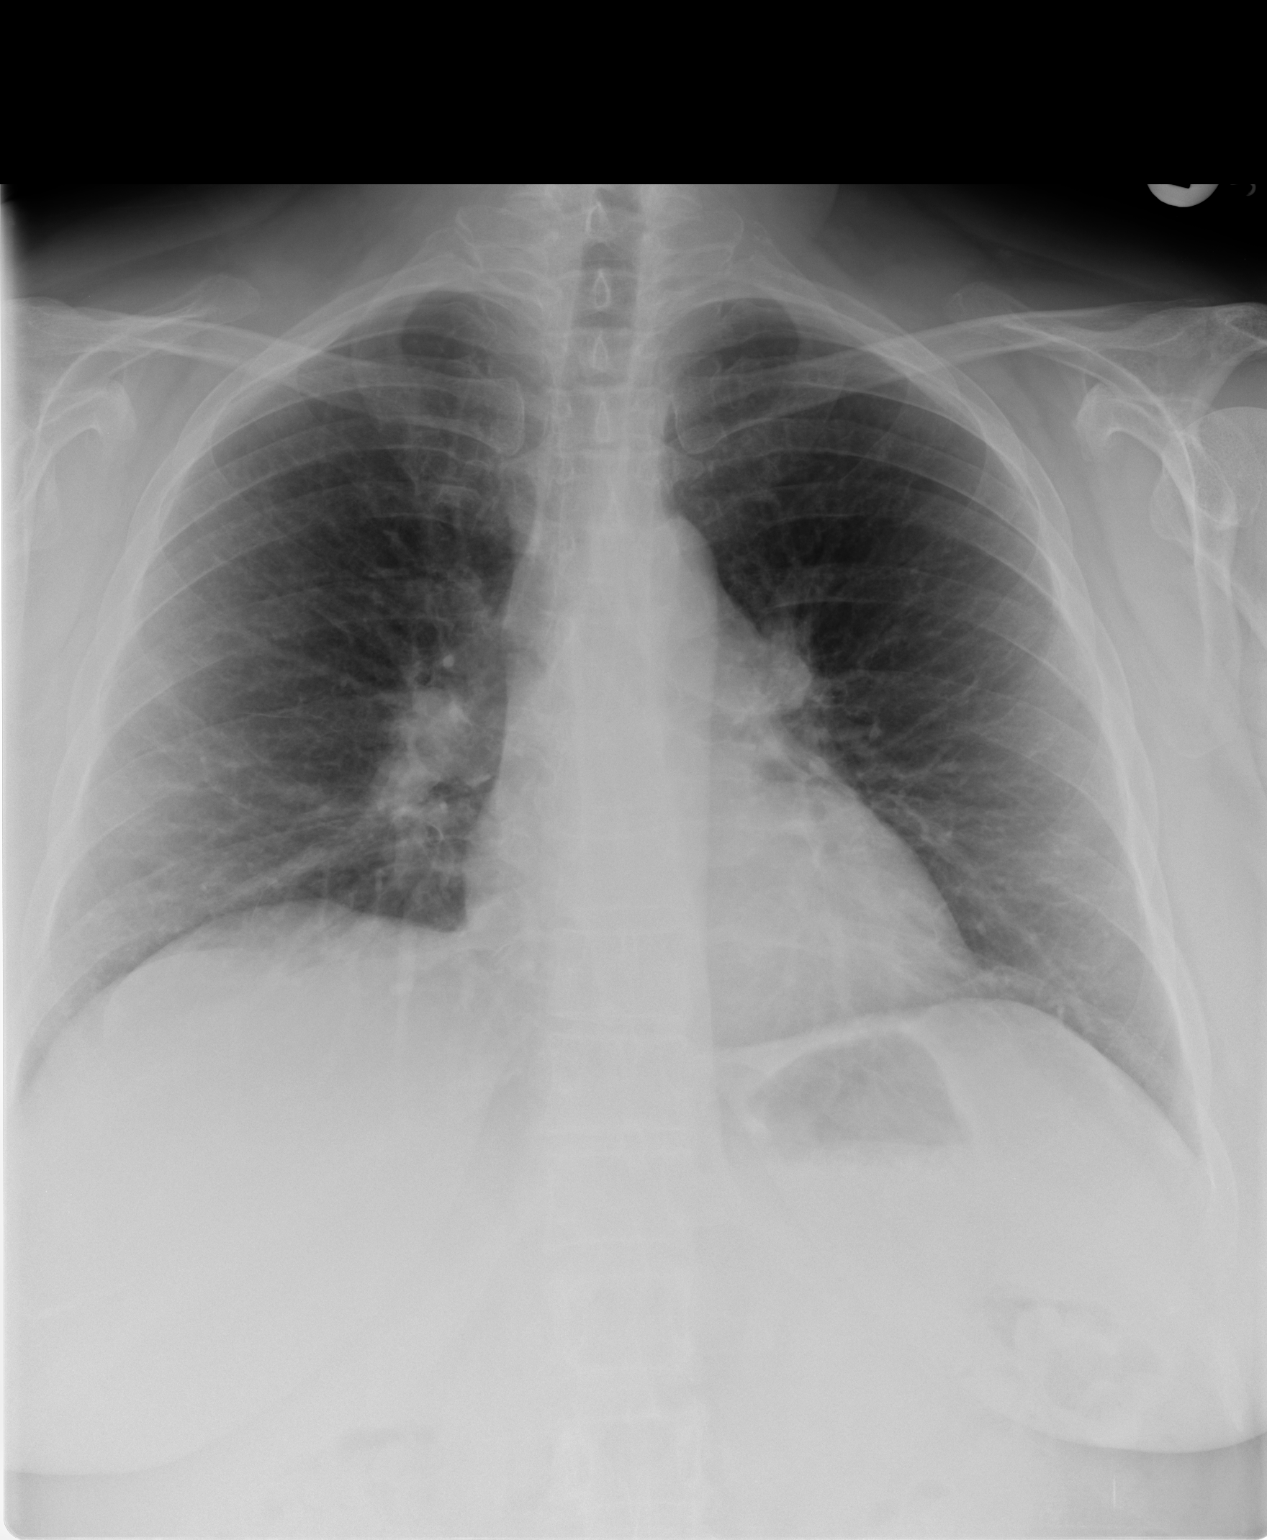

[view not recorded (2 of 2)]
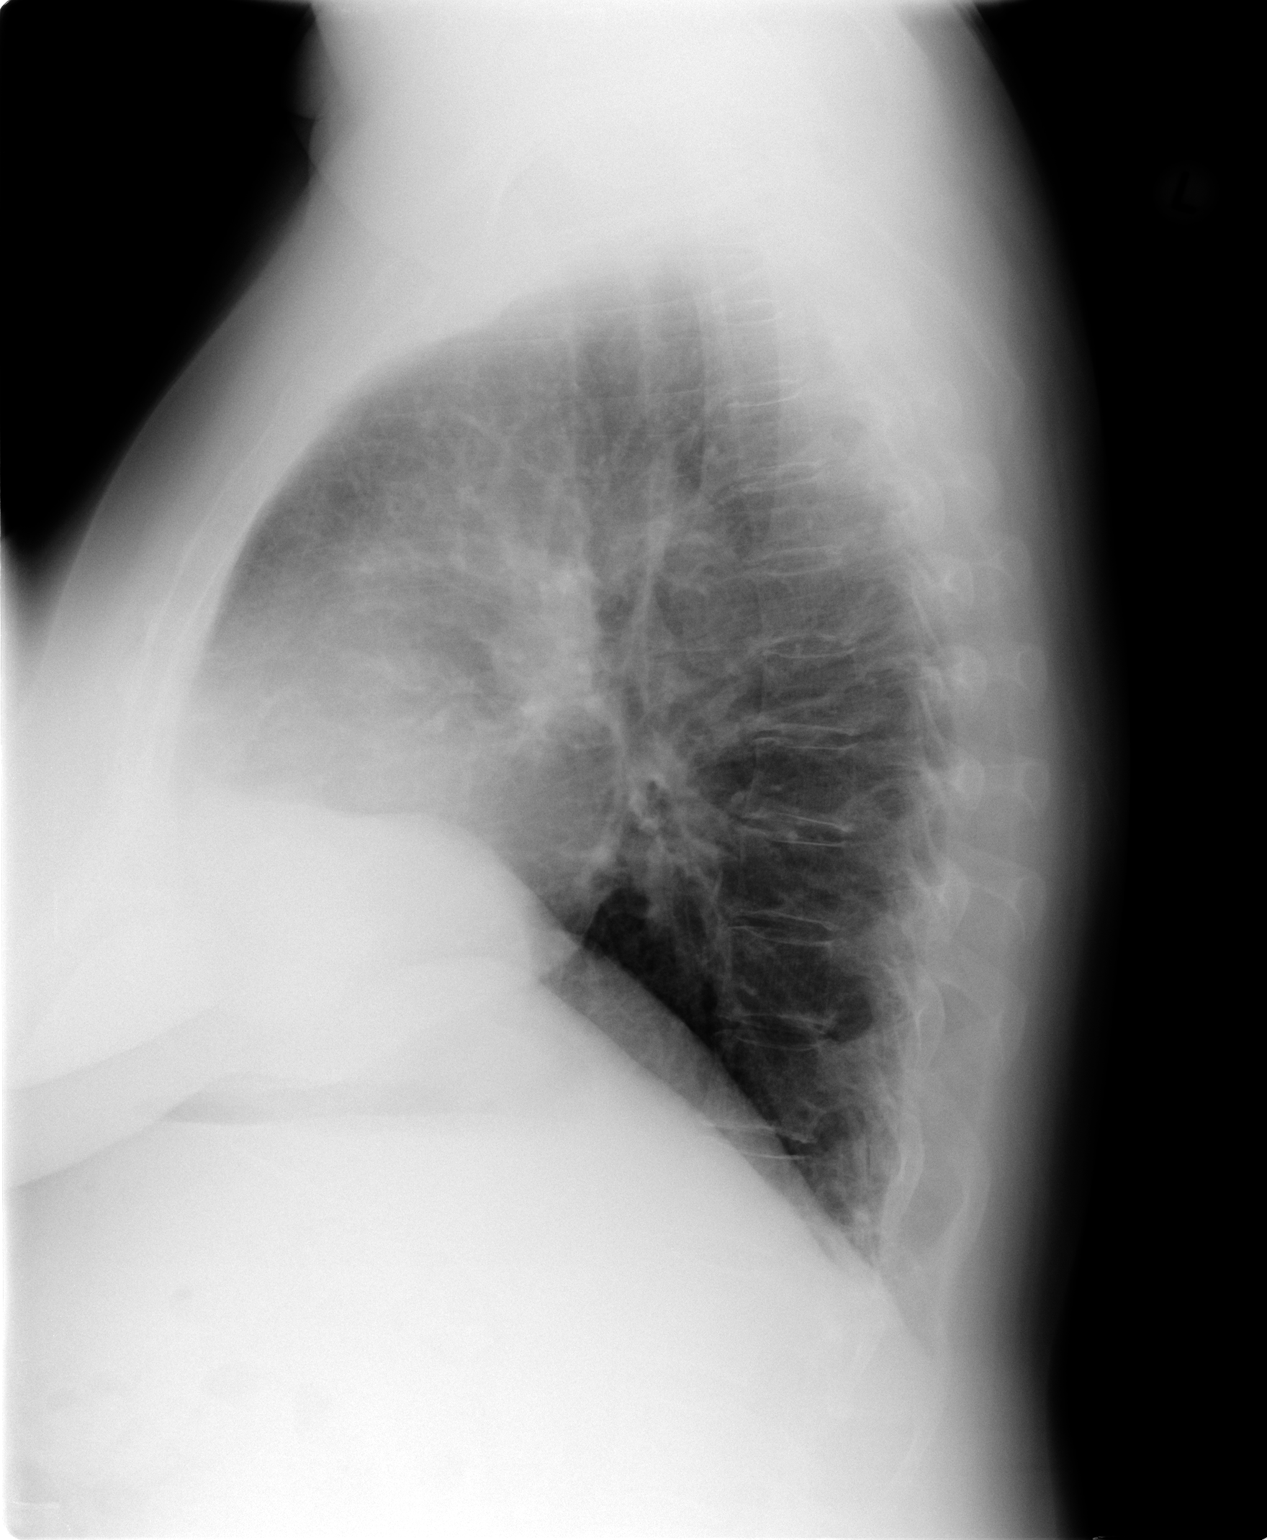

[2 of 2 positions shown; findings below may reference images not displayed]

FINDINGS: Lungs are essentially clear. No focal consolidation. No pleural
effusion or pneumothorax.

The heart is normal in size.

Visualized osseous structures are within normal limits.
IMPRESSION: No evidence of acute cardiopulmonary disease.

## 2017-04-22 IMAGING — DX DG CHEST 2V
2 series · 2 of 2 positions shown · non-contrast
Comparison: Chest radiograph July 14, 2014 and chest CT January 21, 2015

CLINICAL DATA: Hyperkalemia. History of right-sided renal cell
carcinoma

EXAM:
CHEST  2 VIEW

[chest pa]
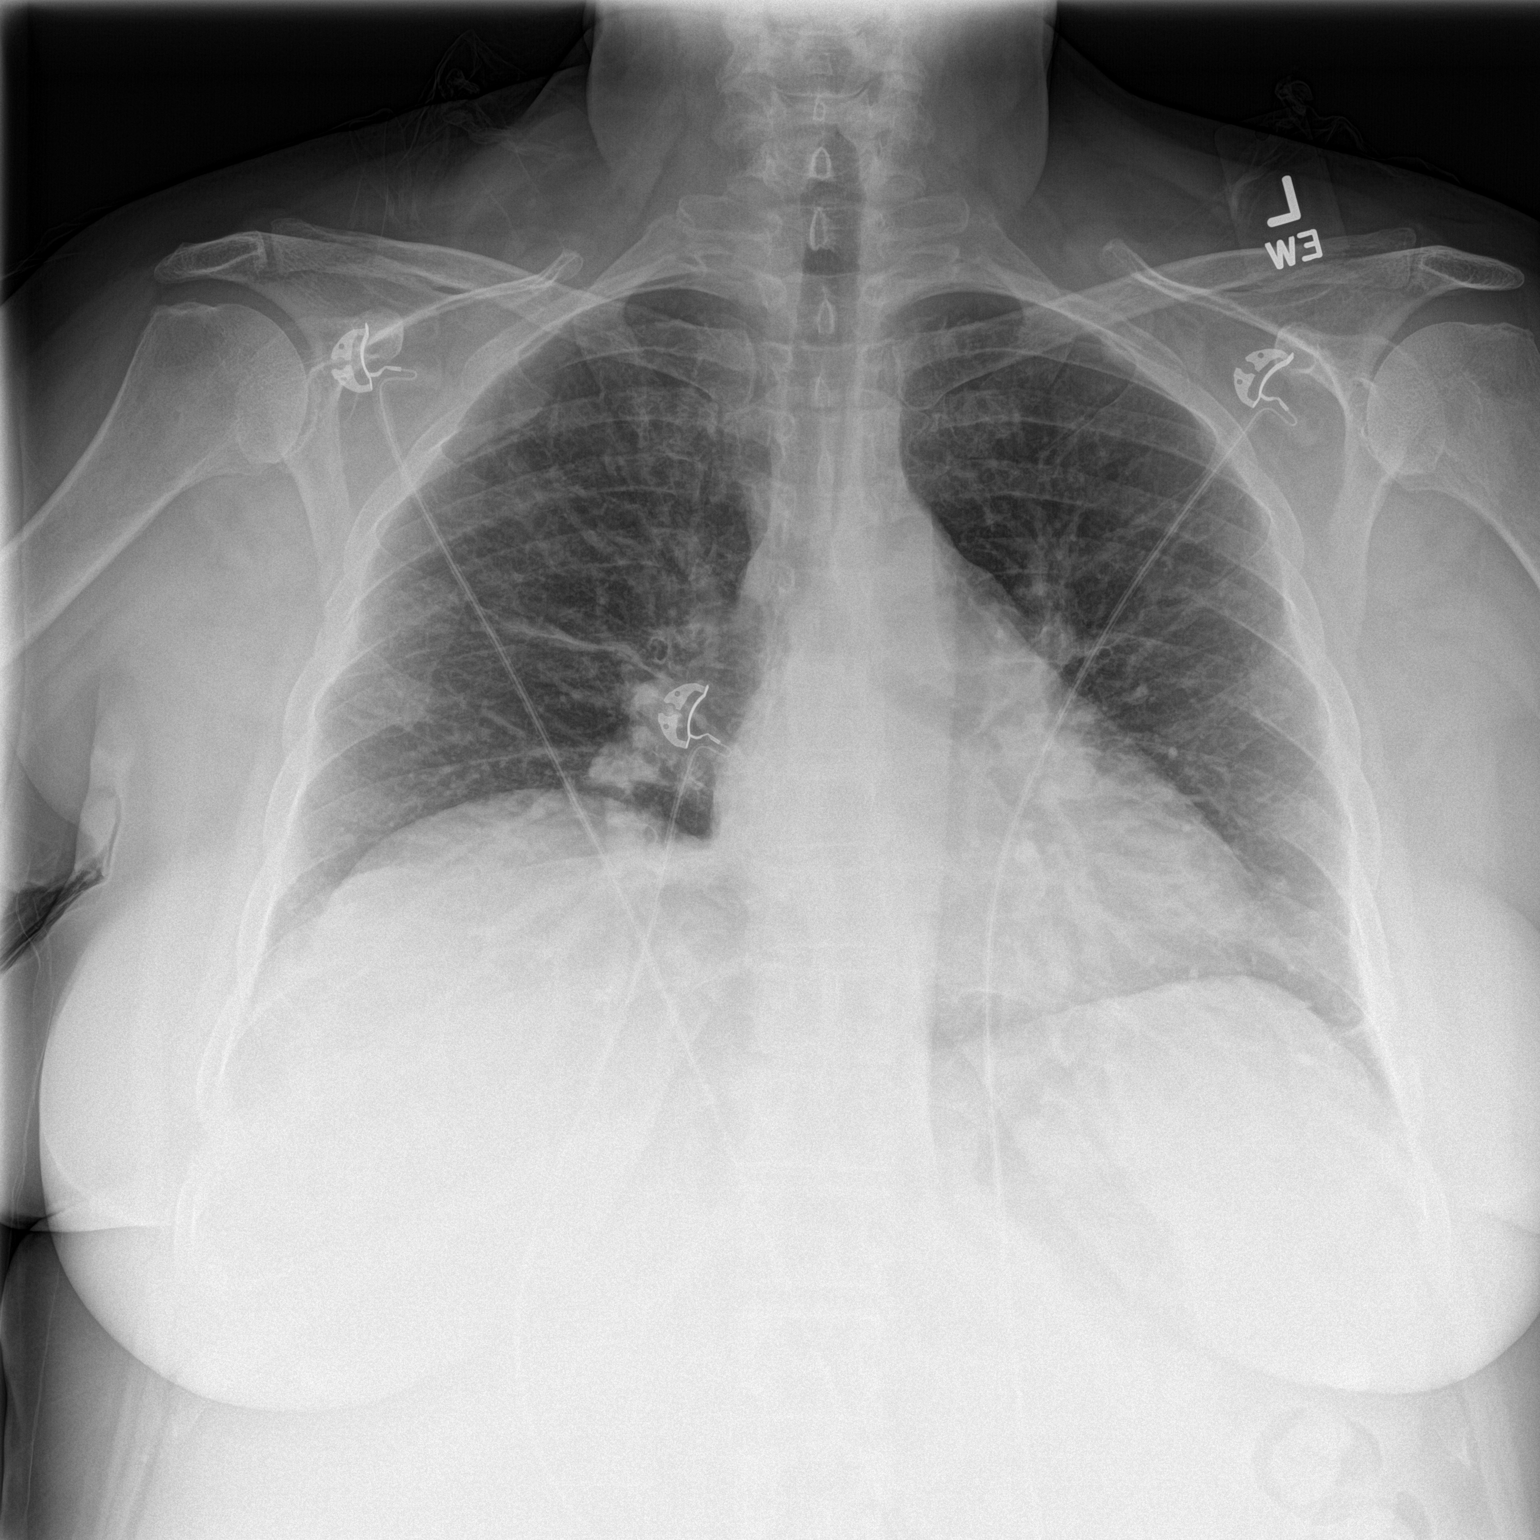

[chest lat]
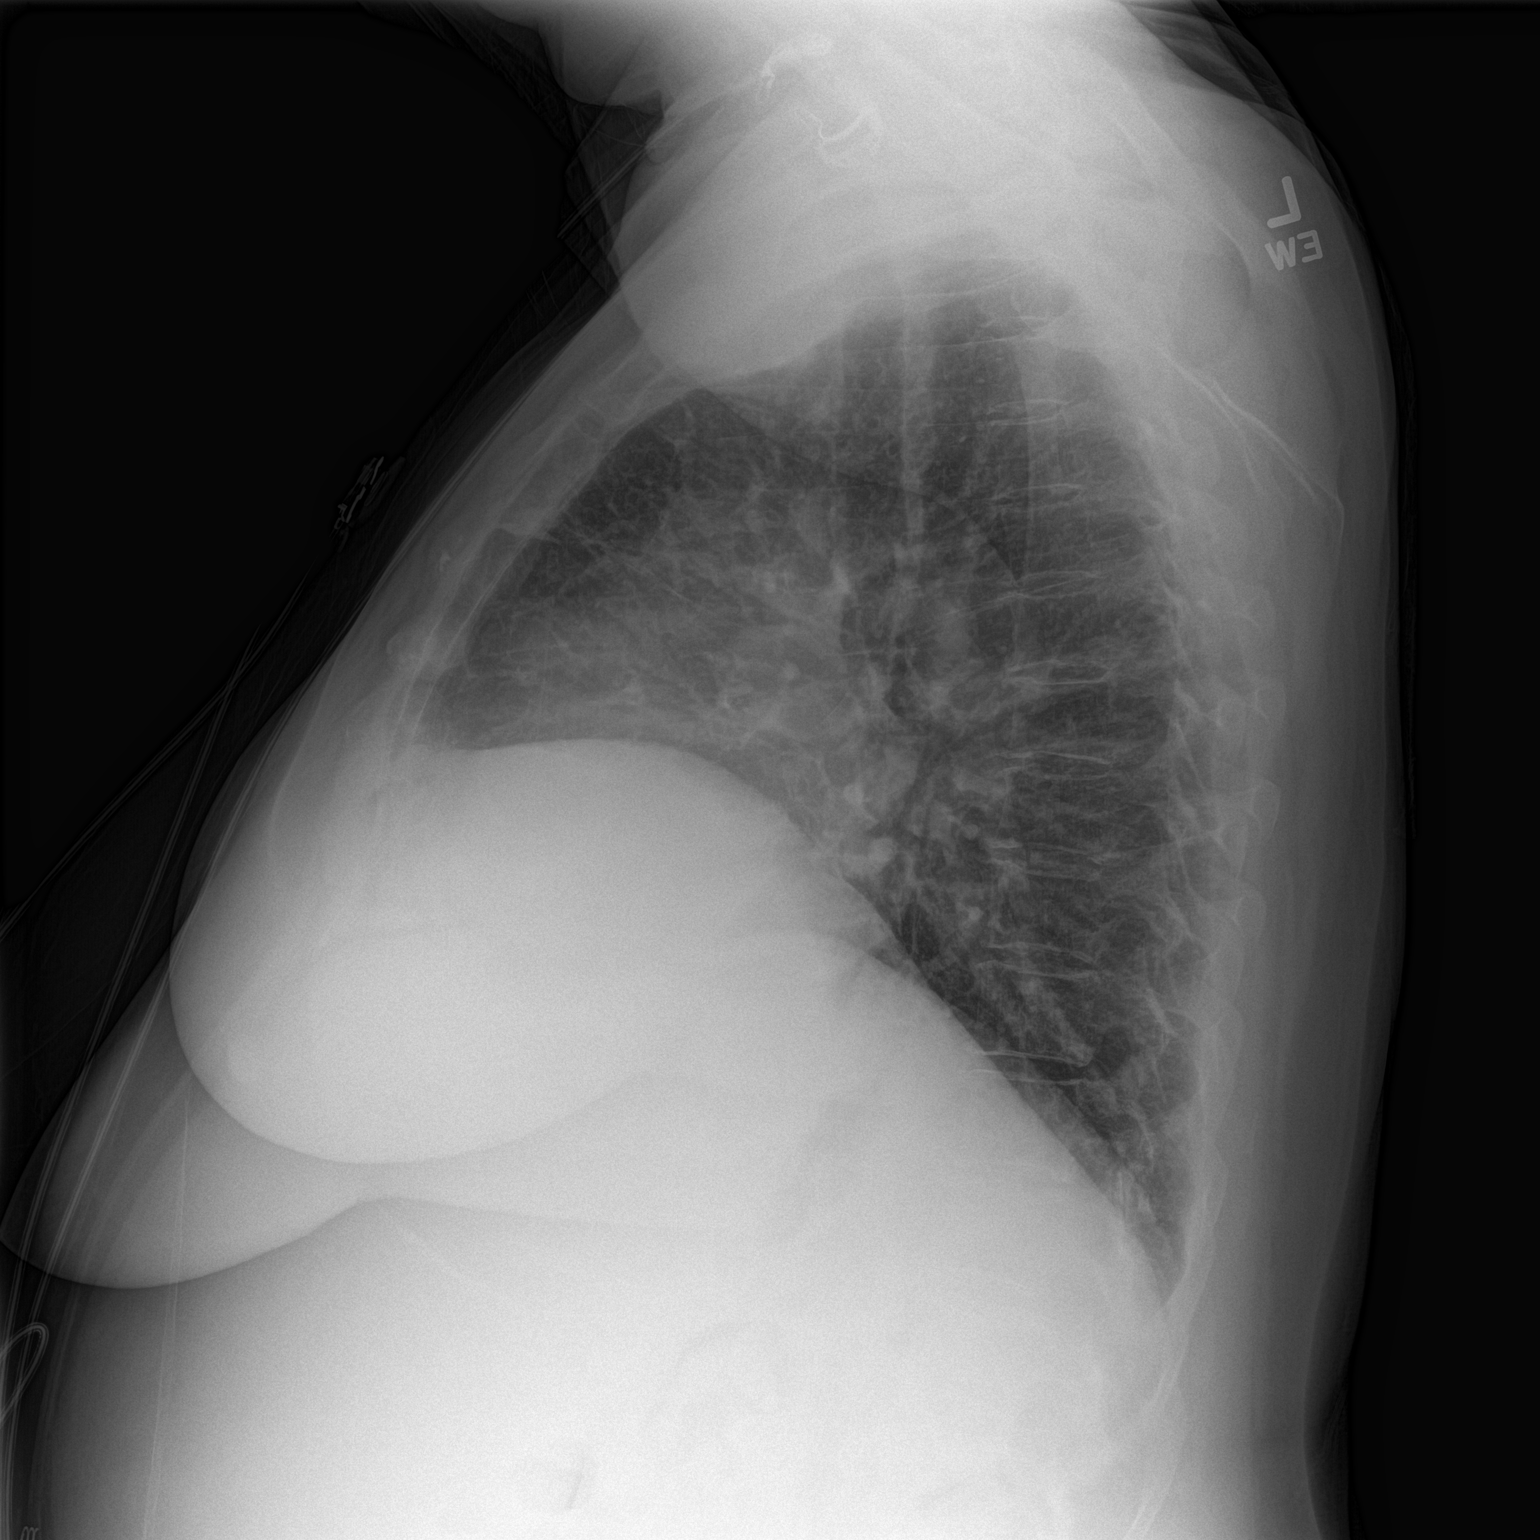

[2 of 2 positions shown; findings below may reference images not displayed]

FINDINGS: Scattered subsegmental nodular opacities are noted bilaterally,
better seen on CT. The degree of inspiration is shallow. There is
mild atelectasis in the right mid lung and left base regions. There
is no edema or consolidation. The heart size and pulmonary
vascularity are normal. No adenopathy. No bone lesions.
IMPRESSION: Small nodular opacities are noted in the lungs, better seen on
recent CT. Small metastases must be of concern given the clinical
history. No edema or consolidation. Mild atelectasis right mid lung
and left base regions.
